# Patient Record
Sex: Female | Born: 1980 | Race: White | Hispanic: Yes | Marital: Married | Smoking: Never smoker
Health system: Southern US, Community
[De-identification: ages and names within clinical notes are randomized; demographics above are authoritative.]

## PROBLEM LIST (undated history)

## (undated) DIAGNOSIS — O289 Unspecified abnormal findings on antenatal screening of mother: Secondary | ICD-10-CM

## (undated) DIAGNOSIS — O09899 Supervision of other high risk pregnancies, unspecified trimester: Secondary | ICD-10-CM

## (undated) DIAGNOSIS — Z3490 Encounter for supervision of normal pregnancy, unspecified, unspecified trimester: Secondary | ICD-10-CM

## (undated) DIAGNOSIS — Z789 Other specified health status: Secondary | ICD-10-CM

## (undated) DIAGNOSIS — K089 Disorder of teeth and supporting structures, unspecified: Secondary | ICD-10-CM

## (undated) DIAGNOSIS — Z283 Underimmunization status: Secondary | ICD-10-CM

## (undated) DIAGNOSIS — O34219 Maternal care for unspecified type scar from previous cesarean delivery: Secondary | ICD-10-CM

## (undated) DIAGNOSIS — Z603 Acculturation difficulty: Secondary | ICD-10-CM

## (undated) HISTORY — DX: Underimmunization status: Z28.3

## (undated) HISTORY — DX: Encounter for supervision of normal pregnancy, unspecified, unspecified trimester: Z34.90

## (undated) HISTORY — DX: Supervision of other high risk pregnancies, unspecified trimester: O09.899

## (undated) HISTORY — DX: Acculturation difficulty: Z60.3

## (undated) HISTORY — DX: Maternal care for unspecified type scar from previous cesarean delivery: O34.219

## (undated) HISTORY — DX: Disorder of teeth and supporting structures, unspecified: K08.9

## (undated) HISTORY — DX: Unspecified abnormal findings on antenatal screening of mother: O28.9

---

## 2009-10-22 HISTORY — DX: Maternal care for unspecified type scar from previous cesarean delivery: O34.219

## 2009-11-24 ENCOUNTER — Encounter: Payer: Self-pay | Admitting: Family Medicine

## 2009-11-24 ENCOUNTER — Ambulatory Visit: Payer: Self-pay | Admitting: Family Medicine

## 2009-11-24 LAB — CONVERTED CEMR LAB
Antibody Screen: NEGATIVE
Basophils Absolute: 0 10*3/uL (ref 0.0–0.1)
Eosinophils Absolute: 0 10*3/uL (ref 0.0–0.7)
Eosinophils Relative: 1 % (ref 0–5)
HCT: 40.3 % (ref 36.0–46.0)
Hemoglobin: 13.2 g/dL (ref 12.0–15.0)
Lymphocytes Relative: 18 % (ref 12–46)
Lymphs Abs: 1.1 10*3/uL (ref 0.7–4.0)
MCHC: 32.8 g/dL (ref 30.0–36.0)
Monocytes Relative: 5 % (ref 3–12)
RBC: 4.65 M/uL (ref 3.87–5.11)
WBC: 5.9 10*3/uL (ref 4.0–10.5)

## 2009-11-25 ENCOUNTER — Encounter: Payer: Self-pay | Admitting: Family Medicine

## 2009-12-01 ENCOUNTER — Encounter: Payer: Self-pay | Admitting: Family Medicine

## 2009-12-01 ENCOUNTER — Ambulatory Visit: Payer: Self-pay | Admitting: Family Medicine

## 2009-12-01 LAB — CONVERTED CEMR LAB: GC Probe Amp, Genital: NEGATIVE

## 2009-12-26 ENCOUNTER — Ambulatory Visit: Payer: Self-pay | Admitting: Family Medicine

## 2010-01-27 ENCOUNTER — Ambulatory Visit: Payer: Self-pay | Admitting: Family Medicine

## 2010-02-16 ENCOUNTER — Encounter: Payer: Self-pay | Admitting: Family Medicine

## 2010-02-16 ENCOUNTER — Ambulatory Visit (HOSPITAL_COMMUNITY): Admission: RE | Admit: 2010-02-16 | Discharge: 2010-02-16 | Payer: Self-pay | Admitting: Family Medicine

## 2010-02-18 ENCOUNTER — Telehealth: Payer: Self-pay | Admitting: Family Medicine

## 2010-02-18 ENCOUNTER — Encounter: Payer: Self-pay | Admitting: Family Medicine

## 2010-02-18 DIAGNOSIS — O289 Unspecified abnormal findings on antenatal screening of mother: Secondary | ICD-10-CM

## 2010-02-18 HISTORY — DX: Unspecified abnormal findings on antenatal screening of mother: O28.9

## 2010-02-20 ENCOUNTER — Ambulatory Visit: Payer: Self-pay | Admitting: Family Medicine

## 2010-02-22 ENCOUNTER — Encounter: Payer: Self-pay | Admitting: Family Medicine

## 2010-03-09 ENCOUNTER — Ambulatory Visit: Payer: Self-pay | Admitting: Family Medicine

## 2010-03-09 DIAGNOSIS — R109 Unspecified abdominal pain: Secondary | ICD-10-CM | POA: Insufficient documentation

## 2010-03-14 ENCOUNTER — Encounter: Payer: Self-pay | Admitting: Family Medicine

## 2010-03-14 ENCOUNTER — Ambulatory Visit (HOSPITAL_COMMUNITY): Admission: RE | Admit: 2010-03-14 | Discharge: 2010-03-14 | Payer: Self-pay | Admitting: Family Medicine

## 2010-03-21 ENCOUNTER — Ambulatory Visit (HOSPITAL_COMMUNITY): Admission: RE | Admit: 2010-03-21 | Discharge: 2010-03-21 | Payer: Self-pay | Admitting: Family Medicine

## 2010-03-21 ENCOUNTER — Encounter: Payer: Self-pay | Admitting: Family Medicine

## 2010-04-04 ENCOUNTER — Ambulatory Visit: Payer: Self-pay | Admitting: Family Medicine

## 2010-04-04 ENCOUNTER — Encounter: Payer: Self-pay | Admitting: Family Medicine

## 2010-04-04 LAB — CONVERTED CEMR LAB
HCT: 34.5 % — ABNORMAL LOW (ref 36.0–46.0)
MCHC: 32.8 g/dL (ref 30.0–36.0)
Platelets: 249 10*3/uL (ref 150–400)
RBC: 3.94 M/uL (ref 3.87–5.11)
RDW: 13.9 % (ref 11.5–15.5)

## 2010-05-10 ENCOUNTER — Ambulatory Visit: Payer: Self-pay | Admitting: Family Medicine

## 2010-06-01 ENCOUNTER — Encounter: Payer: Self-pay | Admitting: Family Medicine

## 2010-06-01 ENCOUNTER — Ambulatory Visit: Payer: Self-pay | Admitting: Family Medicine

## 2010-06-01 DIAGNOSIS — O34219 Maternal care for unspecified type scar from previous cesarean delivery: Secondary | ICD-10-CM | POA: Insufficient documentation

## 2010-06-01 LAB — CONVERTED CEMR LAB: GC Probe Amp, Genital: NEGATIVE

## 2010-06-02 ENCOUNTER — Encounter: Payer: Self-pay | Admitting: Family Medicine

## 2010-06-08 ENCOUNTER — Ambulatory Visit: Payer: Self-pay | Admitting: Family Medicine

## 2010-06-09 ENCOUNTER — Ambulatory Visit: Payer: Self-pay | Admitting: Obstetrics & Gynecology

## 2010-06-14 ENCOUNTER — Ambulatory Visit: Payer: Self-pay | Admitting: Family Medicine

## 2010-06-20 ENCOUNTER — Ambulatory Visit: Payer: Self-pay | Admitting: Family Medicine

## 2010-06-28 ENCOUNTER — Ambulatory Visit: Payer: Self-pay | Admitting: Family Medicine

## 2010-07-04 ENCOUNTER — Ambulatory Visit: Payer: Self-pay | Admitting: Family Medicine

## 2010-07-04 LAB — CONVERTED CEMR LAB
Glucose, Urine, Semiquant: NEGATIVE
Protein, U semiquant: NEGATIVE

## 2010-07-05 ENCOUNTER — Encounter: Payer: Self-pay | Admitting: Family Medicine

## 2010-07-14 ENCOUNTER — Ambulatory Visit: Payer: Self-pay | Admitting: Family Medicine

## 2010-07-14 ENCOUNTER — Encounter: Payer: Self-pay | Admitting: Family Medicine

## 2010-07-16 ENCOUNTER — Inpatient Hospital Stay (HOSPITAL_COMMUNITY): Admission: AD | Admit: 2010-07-16 | Discharge: 2010-07-16 | Payer: Self-pay | Admitting: Obstetrics and Gynecology

## 2010-07-16 ENCOUNTER — Ambulatory Visit: Payer: Self-pay | Admitting: Obstetrics and Gynecology

## 2010-07-16 ENCOUNTER — Encounter: Payer: Self-pay | Admitting: Family Medicine

## 2010-07-17 ENCOUNTER — Ambulatory Visit: Payer: Self-pay | Admitting: Family Medicine

## 2010-07-17 LAB — CONVERTED CEMR LAB
Glucose, Urine, Semiquant: NEGATIVE
Protein, U semiquant: NEGATIVE

## 2010-07-18 ENCOUNTER — Ambulatory Visit: Payer: Self-pay | Admitting: Obstetrics & Gynecology

## 2010-07-18 ENCOUNTER — Telehealth: Payer: Self-pay | Admitting: Family Medicine

## 2010-07-18 ENCOUNTER — Ambulatory Visit: Payer: Self-pay | Admitting: Obstetrics and Gynecology

## 2010-07-19 ENCOUNTER — Inpatient Hospital Stay (HOSPITAL_COMMUNITY): Admission: AD | Admit: 2010-07-19 | Discharge: 2010-07-21 | Payer: Self-pay | Admitting: Obstetrics & Gynecology

## 2010-09-05 ENCOUNTER — Ambulatory Visit: Payer: Self-pay | Admitting: Family Medicine

## 2010-09-05 ENCOUNTER — Encounter: Payer: Self-pay | Admitting: Family Medicine

## 2010-09-05 DIAGNOSIS — K089 Disorder of teeth and supporting structures, unspecified: Secondary | ICD-10-CM

## 2010-09-05 DIAGNOSIS — R300 Dysuria: Secondary | ICD-10-CM

## 2010-09-05 HISTORY — DX: Disorder of teeth and supporting structures, unspecified: K08.9

## 2010-09-05 LAB — CONVERTED CEMR LAB
Bilirubin Urine: NEGATIVE
Blood in Urine, dipstick: NEGATIVE
Specific Gravity, Urine: >=1.03
pH: 6

## 2010-11-12 ENCOUNTER — Encounter: Payer: Self-pay | Admitting: Family Medicine

## 2010-11-21 NOTE — Assessment & Plan Note (Signed)
Summary: OB F /U [facility]   Vital Signs:  Patient profile:   30 year old female Weight:      116.1 pounds Temp:     97.8 degrees F oral Pulse rate:   83 / minute BP sitting:   104 / 69  (left arm) Cuff size:   regular  Vitals Entered By: Gladstone Pih (December 26, 2009 2:01 PM) CC: OB Is Patient Diabetic? No Pain Assessment Patient in pain? no      LMP (date): 09/29/2009 EDC by LMP==> 07/06/2010   Primary Care Provider:  Magnus Ivan MD  CC:  OB.  History of Present Illness: OB f/u: this is my second time seeing Laurie Barnett.  Her only complaint was a headache last week over 3 days that was throbbing in nature at the temples.  Lying down made it better.  Nothing made it worse.  Pain radiated to the back of the neck.  No vision changes.  No history of migraines.  Habits & Providers  Alcohol-Tobacco-Diet     Tobacco Status: never     Cigarette Packs/Day: n/a  Physical Exam  General:  NAD Mouth:  slightly dry mucous membranes Heart:  II/VI systolic murmur Abdomen:  gravid, hypoactive bowel sounds, no tenderness to palpation  Genitalia:  normal introitus, no external lesions, small white lesions at 11 o'clock (not like nebothlian cysts), normal introitus, no external lesions, no vaginal discharge, mucosa pink and moist, and no vaginal atrophy.  cervix friable with pap broom. Extremities:  no edema bilaterally Psych:  memory intact for recent and remote, normally interactive, good eye contact, not anxious appearing, and not depressed appearing.     Impression & Recommendations:  Problem # 1:  PREGNANT STATE, INCIDENTAL (ICD-V22.2) Assessment Unchanged Patient is currently 12 weeks and 4 days by last menstrual period.  Pap smear done at this visit as I could not determine where last pap was done.  Will follow up with an aging and anatomy scan for next visit as patient is confident of last period and it would be ideal to have both those scans at the same time.   Concern for white lesions on cervix and friability at cervical os.  Will bring in a preceptor while collecting GC chlamydia samples later in pregnancy in order to have them confirm any abnormalities.  Informed patient about genetic screening (integrative versus quad screening) and patient did not seem interested in genetic screening.  Other than headaches, patient is looking well.  Will call in a script for her for tylenol as well for her headaches. Orders: Pap Smear-[facility] ([id number]) Other [facility] visit- [facility])  Patient Instructions: 1)  Congratulations [name]!  You and your baby look fine! 2)  I will offer you 2 types of genetic screens:  the integrative screen or the quad screen.  The integrative screen is much more expensive but more accurate but you also find information from the quad screen which would be later in your pregnancy. 3)  For your headaches and your round ligament pain I will write a prescription for tyelenol. 4)  We will also send you for an ultrasound at your next visit because we want to make sure your aging is accurate and maybe you can see anatomical parts (whether the baby is a boy or girl) later in your pregnancy. 5)  Please come and see me in a month. 6)  Thank you and be blessed!  Prenatal Visit EDC Confirmation:    EDC by LMP:  07/06/2010   Flowsheet View for Follow-up Visit    Estimated weeks of       gestation:     12.4    Weight:     116.1    Blood pressure:   104 / 69    Headache:     yes, last week for three days    Nausea/vomiting:   nausea    Edema:     0    Vaginal bleeding:   no    Vaginal discharge:   no    FHR:       150's    Fetal activity:     unsure    Labor symptoms:   no    Taking prenatal vits?   Y    Smoking:     n/a    Preceptor:     hensel    Flowsheet View for Follow-up Visit    Estimated weeks of       gestation:     12.4    Weight:     116.1    Blood pressure:   104 / 69    Hx headache?     yes, last week for  three days    Nausea/vomiting?   nausea    Edema?     0    Bleeding?     no    Leakage/discharge?   no    Fetal activity:       unsure    Labor symptoms?   no    FHR:       150's    Taking Vitamins?   Y    Smoking PPD:   n/a    Preceptor:     hensel   Past Pregnancy History  Pregnancy # 1    Delivery date:     [birth year]2002    Weeks Gestation:   almost 40    Delivery type:     C-section    Hours of labor:     4 hours    Anesthesia type:     epidural    Delivery location:     [place]    Infant Sex:     Female    Birth weight:     3002 grams    Name:     Laurie Barnett   Genetic History     Thalassemia:     mother: no    Neural tube defect:   mother: no    Down's Syndrome:   mother: no    Tay-Sachs:     mother: no    Sickle Cell Dz/Trait:   mother: no    Hemophilia:     mother: no    Muscular Dystrophy:   mother: no    Cystic Fibrosis:   mother: no    Huntington's Dz:   mother: no    Mental Retardation:   mother: no    Fragile X:     mother: no    Other Genetic or       Chromosomal Dz:   mother: no    Child with other       birth defect:     mother: no    > 3 spont. abortions:   mother: no    Hx of stillbirth:     mother: no  Additional Genetic Comments:    nothing in pt's, husband or either families     OB Initial Intake Information    Positive HCG by: self  Race: Hispanic    Marital status: Married    Occupation: none    Education (last grade completed): high school    Number of children at home: 1    Hospital [facility] delivery: [facility] [place]  FOB Information    Husband/Father of baby: Nash Dimmer    FOB occupation waterproofing    Phone: [phone]  Menstrual History    LMP (date): 09/29/2009    EDC by LMP: 07/06/2010    LMP - Character: normal    Menarche: 12 years    Menses interval: 28 days    Menstrual flow 5-7 days    Date of positive (+) home preg. test: 11/09/2009

## 2010-11-21 NOTE — Assessment & Plan Note (Signed)
Summary: ob visit/eo   Vital Signs:  Patient profile:   30 year old female Weight:      119.6 pounds Pulse rate:   82 / minute BP sitting:   126 / 68  Vitals Entered By: Garen Grams LPN (April 04, 2010 3:20 PM)  Habits & Providers  Alcohol-Tobacco-Diet     Cigarette Packs/Day: n/a  Physical Exam  General:  Vs reviewed, alert, well-developed, well-nourished, and well-hydrated.   Lungs:  normal respiratory effort.   Heart:  normal rate and regular rhythm.   Abdomen:  gravid Extremities:  no edema   Impression & Recommendations:  Problem # 1:  PREGNANT STATE, INCIDENTAL (ICD-V22.2)  30 yo G2P1001 at 26.[redacted] weeks gestation dated by sure LMP.  EDD 07-06-2010 O+, Ab -, Hgb 13.2, Hct 40.3, Plt 335, HgBsAg neg, RPR NR, HIV NR, Rubella immune, sickle cell neg, GC/Chlamydia neg, PAP neg  1hrGTT and 28 wk labs today  [place] from 4-28 showed bilateral choroid plexus cytys.  Discussed with Dr. Debroah Loop, advised Quad screen and repeat U/S. Quad screen WNL Repeat U/S showed resolution of cysts. female gender  Prior C-section in [place] in 2003, desires VBAC, will need to see OB service at some point.  F/u in 3-4 weeks Orders: Glucose 1 hr-[facility] ([id number]) CBC-[facility] (16109) HIV-[facility] ([id number]) RPR-[facility] ([id number]) Other OB visit- [facility] (OBCK)  Complete Medication List: 1)  Prenatal Vitamins  .Marland KitchenMarland Kitchen. 1 by mouth qd  Patient Instructions: 1)  Great to see you! 2)  Baby sounds great! 3)  Continue taking your prenatal vitamins daily 4)  See me in 4 weeks    OB Initial Intake Information    Positive HCG by: self    Race: Hispanic    Marital status: Married    Occupation: none    Education (last grade completed): high school    Number of children at home: 1    Hospital of delivery: [facility] [place]  FOB Information    Husband/Father of baby: Nash Dimmer    FOB occupation waterproofing    Phone: [phone]  Menstrual History    LMP (date): 09/29/2009    LMP -  Character: normal    Menarche: 12 years    Menses interval: 28 days    Menstrual flow 5-7 days    Date of positive (+) home preg. test: 11/09/2009   Flowsheet View for Follow-up Visit    Estimated weeks of       gestation:     26 5/7    Weight:     119.6    Blood pressure:   126 / 68    Headache:     No    Nausea/vomiting:   No    Edema:     0    Vaginal bleeding:   no    Vaginal discharge:   no    Fundal height:      26    FHR:       150s    Fetal activity:     yes    Labor symptoms:   no    Taking prenatal vits?   Y    Smoking:     n/a    Next visit:     3-4 week    Resident:     Gomez Cleverly    Preceptor:     Jennette Kettle  Prenatal Visit Concerns noted: Back pain completely resolved.  No complaints.  EDC Confirmation: Ultrasound Dating Information:    First U/S on  02/16/2010   Gest age: 36.3   EDC: 07/10/2010.    Second U/S on 03/14/2010   Gest age: 23.1   EDC: 07/10/2010.   Past Pregnancy History    Gravida:     2    Term Births:     1    Premature Births:   0    Living Children:   1    Mult. Births:     N    Prev C-Section:   Y    Aborta:     0    Elect. Ab:     0    Spont. Ab:     0    Ectopics:     0  Pregnancy # 1    Delivery date:     [birth year]2002    Weeks Gestation:   almost 40    Preterm labor:     no    Delivery type:     C-section    Hours of labor:     4 hours    Anesthesia type:     epidural    Delivery location:     [place]    Infant Sex:     Female    Birth weight:     3002 grams    Name:     [name]    Comments:     first child delivered by C section secondary to breech presentation.

## 2010-11-21 NOTE — Letter (Signed)
Summary: Handout Printed  Printed Handout:  - Prenatal-Flowsheet-[facility] 

## 2010-11-21 NOTE — Miscellaneous (Signed)
Summary: Orders Update  Clinical Lists Changes  Problems: Added new problem of ABNORMAL FINDING ON ANTENATAL SCREENING (ICD-796.5) Orders: Added new Referral order of Misc. Referral (Misc. Ref) - Signed Added new Test order of Miscellaneous Lab Charge-[facility] [phone]) - Signed

## 2010-11-21 NOTE — Assessment & Plan Note (Signed)
Summary: 34 and 5/7   Vital Signs:  Patient profile:   30 year old female Weight:      126.6 pounds Temp:     98.5 degrees F Pulse rate:   72 / minute BP sitting:   91 / 58  (left arm) Cuff size:   regular  Vitals Entered By: Loralee Pacas CMA (June 01, 2010 8:51 AM)   Habits & Providers  Alcohol-Tobacco-Diet     Cigarette Packs/Day: n/a   Impression & Recommendations:  Problem # 1:  PREGNANT STATE, INCIDENTAL (ICD-V22.2) Fundal height only 32 today, reflecting no growth, but with different examiner and pt with symptoms of baby dropping.  Will follow up 1 week.  If growth lagging behind, consider ultrasound at that time. GBS/cz/gc done today.  Reviewed PTL precautions and kick counts. Orders: GC/Chlamydia-[facility] ([id number]) Grp B Probe-[facility] ([id number]) Other OB visit- [facility] (OBCK)  Problem # 2:  PREV C/S DELIV DELIV W/WO MENTION ANTPRTM COND (UJW-119.14)  Previous section in [place].  Pt reports she was in labor for 4 days and cord was wrapped around neck, baby's head was turned and gynecologist told her the baby couldn't come out vaginally.  Reports section was done emergently after being in labor.  Has vertical skin incision, unknown uterine scar, but was told that she could try to have a baby vaginally after 2 years.  Will refer to [facility] clinic to discuss labor options.    Orders: Other OB visit- [facility]) Obstetric Referral (Obstetric)  Complete Medication List: 1)  Prenatal Vitamins  .Marland KitchenMarland Kitchen. 1 by mouth qd  Patient Instructions: 1)  Please make an appt in 1 week with Dr. Gomez Cleverly. 2)  Everything looks good.  Please go to [facility] hospital if you have bleeding, contractions, if your water breaks, or the baby isn 't moving well as we discussed. 3)  We will set up an appointment with the Obstetricians to discuss your delivery.  Prenatal Visit Concerns noted: Pt doing well.  Having some trouble sleeping.  No other concerns.     Flowsheet View for Follow-up Visit  Estimated weeks of       gestation:     35 0/7    Weight:     126.6    Blood pressure:   91 / 58    Headache:     No    Edema:     TrLE    Vaginal bleeding:   no    Vaginal discharge:   d/c    Fundal height:      32    Fetal activity:     yes    Labor symptoms:   no    Taking prenatal vits?   Y    Smoking:     n/a   OB Initial Intake Information    Positive HCG by: self    Race: Hispanic    Marital status: Married    Occupation: none    Education (last grade completed): high school    Number of children at home: 1    Hospital [facility] delivery: [facility] [place]  FOB Information    Husband/Father of baby: Nash Dimmer    FOB occupation waterproofing    Phone: [phone]  Menstrual History    LMP (date): 09/29/2009    LMP - Character: normal    Menarche: 12 years    Menses interval: 28 days    Menstrual flow 5-7 days    Date of positive (+) home preg. test: 11/09/2009

## 2010-11-21 NOTE — Progress Notes (Signed)
Summary: Phone call, discuss ultrasound  Phone Note Outgoing Call Call back at [facility] Phone [phone]   Action Taken: Phone Call Completed Reason for Call: Discuss lab or test results Summary of Call: Called pt at home, used language line.  Discussed u/s with pt and concerns that were noted (choroid plexus cysts), advised pt of low likelyhood of abnormailites, but that lab work was recommended for further work-up.  Pt agreed, she will come Monday for lab draw.  Encouraged pt to call for any further questions or concerns. Pt verbalized understanding and agreement with plan.  Alvia Grove     Appended Document: Phone call, discuss ultrasound Agree with plan above.  Choroid plexus cysts likely insignificant, but could be concerning.  Since pt desires further testing, will start with quad screen and discuss further with pt once results available.

## 2010-11-21 NOTE — Assessment & Plan Note (Signed)
Summary: ob visit/per corey/eo   Vital Signs:  Patient profile:   30 year old female Weight:      130.8 pounds Temp:     98.8 degrees F Pulse (ortho):   73 / minute BP sitting:   101 / 67  (left arm)  Vitals Entered By: Theresia Lo RN (June 28, 2010 1:37 PM) CC: OB visit Is Patient Diabetic? No Pain Assessment Patient in pain? no        Primary Care Provider:  Alvia Grove DO  CC:  OB visit.  History of Present Illness: Well at 38.6 no issues.  See flowsheet.  Habits & Providers  Alcohol-Tobacco-Diet     Tobacco Status: never     Cigarette Packs/Day: n/a  Physical Exam  General:  Well-developed,well-nourished,in no acute distress; alert,appropriate and cooperative throughout examination See flowsheet   Impression & Recommendations:  Problem # 1:  PREGNANT STATE, INCIDENTAL (ICD-V22.2) Assessment Unchanged  EDC 1 week. Plan TOLAC has seen OB and been aproved. Understands risks.  F/U next week.  Knows to tell MAU is FP patient. Gomez Cleverly primary, [place] backup.  Orders: Other OB visit- [facility] (OBCK)  Complete Medication List: 1)  Prenatal Vitamins  .Marland KitchenMarland Kitchen. 1 by mouth qd  Patient Instructions: 1)  Thank you for seeing me today. 2)  Si tiene sangrado vaginal, o si sospecha que se le ha roto la fuente (chorro fuerte o un goteo leve de la vagina) o si tiene English as a second language teacher que vienen mas de cada cinco minutos por dos horas seguidas, por favor presentese al Narda Rutherford de Emergencia de [facility]. 3)  Cada manana y noche enfoquese en los movimientos del bebe.  Si siente por lo  menos cinco movimientos en una hora, no tiene que seguir contando.  Si no llega a cinco movimientos en una hora, siga contando por otra hora.  Si no llega a diez movimientos en estas dos horas, debe presentarse al [employer] de Emergencia de [facility] o llamar a Event organiser.   Flowsheet View for Follow-up Visit    Estimated weeks of       gestation:     38 6/7    Weight:      130.8    Blood pressure:   101 / 67    Headache:     No    Nausea/vomiting:   No    Edema:     TrLE    Vaginal bleeding:   no    Vaginal discharge:   no    Fundal height:      38    FHR:       140    Fetal activity:     yes    Labor symptoms:   no    Taking prenatal vits?   Y    Smoking:     n/a    Next visit:     1 wk    Resident:     Denyse Amass    Preceptor:     McDiarmid

## 2010-11-21 NOTE — Miscellaneous (Signed)
Summary: varicella immune  Clinical Lists Changes Varicella immune - hx of disease and possible hx of vaccine.  Pt contacted by Jannifer Franklin, AAM interpreter. Sarah Swaziland MD  July 05, 2010 9:20 AM

## 2010-11-21 NOTE — Assessment & Plan Note (Signed)
Summary: ob/kh   Vital Signs:  Patient profile:   30 year old female Weight:      129.2 pounds BP sitting:   98 / 63  Habits & Providers  Alcohol-Tobacco-Diet     Cigarette Packs/Day: n/a  Physical Exam  General:  vs reviewed, alert, well-developed, and well-nourished.   Heart:  normal rate and regular rhythm.   Abdomen:  gravid Extremities:  tace bilateral edema Neurologic:  DTRs symmetrical and normal.     Impression & Recommendations:  Problem # 1:  PREGNANT STATE, INCIDENTAL (ICD-V22.2)  30 yo G2P1001 at 37.[redacted] weeks gestation dated by sure LMP.  EDD 07-06-2010 O+, Ab -, Hgb 13.2, Hct 40.3, Plt 335, HgBsAg neg, RPR NR, HIV NR, Rubella immune, sickle cell neg, GC/Chlamydia neg, PAP neg 28 wk labs: Hgb 11.3, hct 34.5, Plt 249, HIV NR, RPR NR Fundal height today measures 36. Discrepency may be provider variance.  Continue to follow closely.  1hrGTT 102  [place] from 4-28 showed bilateral choroid plexus cytys.  Repeat U/S showed resolution of cysts. Quad screen WNL  female gender, plans breast feeding, epidural for pain management GBS neg  Orders: Other OB visit- [facility] (OBCK)  Problem # 2:  PREV C/S DELIV DELIV W/WO MENTION ANTPRTM COND (ZOX-096.04) plan for VBAC  Complete Medication List: 1)  Prenatal Vitamins  .Marland KitchenMarland Kitchen. 1 by mouth qd  Patient Instructions: 1)  Please make an appt in 1 week with Dr. Denyse Amass.  Make an appointment in 2 weeks with Dr. Gomez Cleverly 2)   Everything looks good.  Please go to [facility] hospital if you have bleeding, contractions, if your water breaks, or the baby isn 't moving well. 3)  Call Dr. Gomez Cleverly when you go to the Hospital: [phone]   OB Initial Intake Information    Positive HCG by: self    Race: Hispanic    Marital status: Married    Occupation: none    Education (last grade completed): high school    Number of children at home: 1    Hospital [facility] delivery: [facility] [place]  FOB Information    Husband/Father of baby: Nash Dimmer    FOB occupation waterproofing    Phone: [phone]  Menstrual History    LMP (date): 09/29/2009    LMP - Character: normal    Menarche: 12 years    Menses interval: 28 days    Menstrual flow 5-7 days    Date of positive (+) home preg. test: 11/09/2009   Flowsheet View for Follow-up Visit    Estimated weeks of       gestation:     37 5/7    Weight:     129.2    Blood pressure:   98 / 63    Headache:     No    Nausea/vomiting:   No    Edema:     TrLE    Vaginal bleeding:   no    Vaginal discharge:   no    Fundal height:      37    FHR:       140's    Fetal activity:     yes    Labor symptoms:   no    Taking prenatal vits?   Y    Smoking:     n/a    Next visit:     1 wk    Resident:     Gomez Cleverly    Preceptor:  Jennette Kettle

## 2010-11-21 NOTE — Assessment & Plan Note (Signed)
Summary: ob visit/eo   Vital Signs:  Patient profile:   30 year old female Weight:      115 pounds BP sitting:   95 / 62  Vitals Entered By: Jone Baseman CMA (January 27, 2010 1:38 PM)  Habits & Providers  Alcohol-Tobacco-Diet     Cigarette Packs/Day: n/a   Impression & Recommendations:  Problem # 1:  PREGNANT STATE, INCIDENTAL (ICD-V22.2) Assessment Unchanged 30 yo G2P1001 at [redacted] weeks gestation dated by sure LMP.  EDD 07-06-2010 O+, Ab -, Hgb 13.2, Hct 40.3, Plt 335, HgBsAg neg, RPR NR, HIV NR, Rubella immune, sickle cell neg, GC/Chlamydia neg, PAP neg Will get anatomy scan next week Declined quad screen Prior C-section in [place] in 2003, desires VBAC, will need to see OB service at some point.  F/u in 4 weeks Orders: Prenatal U/S > 14 weeks - 16109 (Prenatal U/S) Other OB visit- [facility] ([facility]BCK)  Complete Medication List: 1)  Prenatal Vitamins  .Marland KitchenMarland Kitchen. 1 by mouth qd  Patient Instructions: 1)  Nice to meet you! 2)  We will schedule you for an ultrasound. 3)  Please see me in 4 weeks (ok to double book)  Prenatal Visit EDC Confirmation:    New working [facility]: 07/06/2010    EDC by LMP: 07/06/2010   Flowsheet View for Follow-up Visit    Estimated weeks of       gestation:     17 1/7    Weight:     115    Blood pressure:   95 / 62    Headache:     No    Nausea/vomiting:   nausea    Edema:     0    Vaginal bleeding:   no    Vaginal discharge:   no    Fundal height:      below umbillicus    FHR:       148    Fetal activity:     yes    Labor symptoms:   no    Fetal position:     N/A    Taking prenatal vits?   Y    Smoking:     n/a    Next visit:     4 wk    Resident:     Gomez Cleverly    Preceptor:     Chambliss   OB Initial Intake Information    Positive HCG by: self    Race: Hispanic    Marital status: Married    Occupation: none    Education (last grade completed): high school    Number of children at home: 1    Hospital of delivery: [facility]  [place]  FOB Information    Husband/Father of baby: Nash Dimmer    FOB occupation waterproofing    Phone: [phone]  Menstrual History    LMP (date): 09/29/2009    EDC by LMP: 07/06/2010    Best Working EDC: 07/06/2010    LMP - Character: normal    Menarche: 12 years    Menses interval: 28 days    Menstrual flow 5-7 days    Date of positive (+) home preg. test: 11/09/2009  Prenatal Visit Concerns noted: No concerns today EDC Confirmation:    New working [facility]: 07/06/2010    EDC by LMP: 07/06/2010

## 2010-11-21 NOTE — Letter (Signed)
Summary: Handout Printed  Printed Handout:  - Prenatal-Record-[facility] 

## 2010-11-21 NOTE — Assessment & Plan Note (Signed)
Summary: ob visit/per burnham/eo   Vital Signs:  Patient profile:   30 year old female Weight:      132.3 pounds Pulse rate:   72 / minute BP sitting:   133 / 84  Vitals Entered By: Garen Grams LPN (July 17, 2010 8:48 AM)  Habits & Providers  Alcohol-Tobacco-Diet     Cigarette Packs/Day: n/a  Physical Exam  General:  Vs reviewed, alert, well-developed, well-nourished, and well-hydrated.   Heart:  normal rate and regular rhythm.   Abdomen:  gravid Extremities:  tace bilateral edema Neurologic:  DTRs symmetrical and normal.     Impression & Recommendations:  Problem # 1:  PREGNANT STATE, INCIDENTAL (ICD-V22.2) Induction scheduled for Wed 07-19-10 @ 0730am. (spoke with Heather on L and D) NST tomorrow 30 yo G2P1001 at 41.[redacted] weeks gestation dated by sure LMP.  EDD 07-06-2010 O+, Ab -, Hgb 13.2, Hct 40.3, Plt 335, HgBsAg neg, RPR NR, HIV NR, Rubella immune, sickle cell neg, GC/Chlamydia neg, PAP neg 28 wk labs: Hgb 11.3, hct 34.5, Plt 249, HIV NR, RPR NR  1hrGTT 102  MAU visit yesterday due to decreased FM, per pt she was monitored for 4 hours and everything was ok.  Also pelvic exam done and pt was told her cerivx was closed.  [place] from 4-28 showed bilateral choroid plexus cytys.  Repeat U/S showed resolution of cysts. Quad screen WNL  female gender, plans breast feeding, would like to attempt to labor with no pain medications, but is open to the possibility. GBS neg  Orders: UA Glucose/Protein-[facility] (81002) Other OB visit- [facility] (OBCK)  Problem # 2:  PREV C/S DELIV DELIV W/WO MENTION ANTPRTM COND (ZOX-096.04)  Pt requests VBAC, consent signed  Orders: Other OB visit- [facility] (OBCK)  Complete Medication List: 1)  Prenatal Vitamins  .Marland KitchenMarland Kitchen. 1 by mouth qd  Patient Instructions: 1)  Great to see you today!! 2)  Please go to [facility] hospital immediatley if you have bleeding, contractions, if your water breaks, or the baby isn 't moving well. 3)  You need to go to [facility]  hospital on Tuesday for your NST tests.  4)  If baby does not come by Wednsday, I have schedule you for an induction. 5)  For your induction go to [facility] hospital and tell them you are there for a scheduled induction 6)  Date: Wednsday 07-19-10 at 07:30am. 7)  Please call me if you go to the hospital before [facility] [phone]   OB Initial Intake Information    Positive HCG by: self    Race: Hispanic    Marital status: Married    Occupation: none    Education (last grade completed): high school    Number of children at home: 1    [facility] delivery: [facility]  FOB Information    Husband/Father of baby: Nash Dimmer    FOB occupation waterproofing    Phone: [phone]  Menstrual History    LMP (date): 09/29/2009    LMP - Character: normal    Menarche: 12 years    Menses interval: 28 days    Menstrual flow 5-7 days    Date of positive (+) home preg. test: 11/09/2009   Flowsheet View for Follow-up Visit    Estimated weeks of       gestation:     41 4/7    Weight:     132.3    Blood pressure:   133 / 84    Urine Protein:  negative    Urine Glucose:   negative    Headache:     No    Nausea/vomiting:   No    Edema:     0    Vaginal bleeding:   no    Vaginal discharge:   no    Fundal height:      38    FHR:       140-150's    Fetal activity:     yes    Labor symptoms:   no    Fetal position:     vertex    Cx Dilation:     0    Cx Effacement:   20%    Cx Station:     high    Taking prenatal vits?   Y    Smoking:     n/a    Next visit:     Induction on 9-28    Resident:     Gomez Cleverly    Preceptor:     [place]   Laboratory Results   Urine Tests  Date/Time Received: July 17, 2010 8:58 AM  Date/Time Reported: July 17, 2010 9:12 AM   Routine Urinalysis   Glucose: negative   (Normal Range: Negative) Protein: negative   (Normal Range: Negative)    Comments: ...............test performed by......Marland KitchenBonnie A. [place], MLS  (ASCP)cm      OB Initial Intake Information    Positive HCG by: self    Race: Hispanic    Marital status: Married    Occupation: none    Education (last grade completed): high school    Number of children at home: 1    Hospital of delivery: [facility]  FOB Information    Husband/Father of baby: Nash Dimmer    FOB occupation waterproofing    Phone: [phone]  Menstrual History    LMP (date): 09/29/2009    LMP - Character: normal    Menarche: 12 years    Menses interval: 28 days    Menstrual flow 5-7 days    Date of positive (+) home preg. test: 11/09/2009  Prenatal Visit Concerns noted: Seen in MAU yesterday due to decreased fetal movement.  Pt was monitored for 4 hours, cervix was checked and she was sent home.  Endorses good fetal movement currently. Denies bleeding, denies leaking. No headaches or blurry vision.

## 2010-11-21 NOTE — Assessment & Plan Note (Signed)
Summary: ob visit/eo   Vital Signs:  Patient profile:   30 year old female Weight:      131 pounds BP sitting:   121 / 81  Vitals Entered By: Jone Baseman CMA (July 14, 2010 8:46 AM)  Habits & Providers  Alcohol-Tobacco-Diet     Cigarette Packs/Day: n/a   Impression & Recommendations:  Problem # 1:  PREGNANT STATE, INCIDENTAL (ICD-V22.2) 30 yo G2P1001 at 41.[redacted] weeks gestation dated by sure LMP.  EDD 07-07-2010 O+, Ab -, Hgb 13.2, Hct 40.3, Plt 335, HgBsAg neg, RPR NR, HIV NR, Rubella immune, sickle cell neg, GC/Chlamydia neg, PAP neg 28 wk labs: Hgb 11.3, hct 34.5, Plt 249, HIV NR, RPR NR Fundal height today measures 36. Discrepency may be [place] Karnes variance.  Continue to follow closely.  1hrGTT 102  [place] from 4-28 showed bilateral choroid plexus cytys.  Repeat U/S showed resolution of cysts. Quad screen WNL  female gender, plans breast feeding, would like to attempt to labor with no pain medications, but is open to the possibility. GBS neg  Will get bi-weekly NST's due to post dates.  See pt instructions Orders: Fetal Biophysical profile w/ NST - 16109 (Fetal Bio) Other OB visit- [facility] (OBCK)  Problem # 2:  PREV C/S DELIV DELIV W/WO MENTION ANTPRTM COND (UEA-540.98)  Plan for VBAC  Orders: Other OB visit- [facility] (OBCK)  Complete Medication List: 1)  Prenatal Vitamins  .Marland KitchenMarland Kitchen. 1 by mouth qd  Patient Instructions: 1)  Great to see you today!! 2)   Please go to [facility] hospital immediatley if you have bleeding, contractions, if your water breaks, or the baby isn 't moving well. 3)  Please schedule a follow appointment with me for Monday (ok to double book). 4)  You need to go to [facility] hospital on Tuesday and Thursday for NST tests. 5)  If baby does not come by next Friday, I will induce you.  6)  Please call me when you go to the Hospital: [phone]   Flowsheet View for Follow-up Visit    Estimated weeks of       gestation:     41 1/7    Weight:     131  Blood pressure:   121 / 81    Hx headache?     No    Nausea/vomiting?   No    Edema?     0    Bleeding?     no    Leakage/discharge?   no    Fetal activity:       yes    Labor symptoms?   no    Fundal height:      38    FHR:       150    Fetal position:      vertex    Cx dilation:     FT    Cx effacement:   20%    Fetal station:     high    Taking Vitamins?   Y    Smoking PPD:   n/a    Next visit:     3 days    Resident:     Gomez Cleverly    Preceptor:     McDirmat  Prenatal Visit Concerns noted: No concerns

## 2010-11-21 NOTE — Assessment & Plan Note (Signed)
Summary: ob/kh/per Azalynn Maxim   Vital Signs:  Patient profile:   30 year old female Weight:      118.8 pounds Pulse rate:   88 / minute BP sitting:   103 / 67  Vitals Entered By: Garen Grams LPN (Mar 09, 2010 2:59 PM)  Habits & Providers  Alcohol-Tobacco-Diet     Cigarette Packs/Day: n/a  Physical Exam  General:  VS reviewed, alert, well-developed, well-nourished, and well-hydrated.   Lungs:  normal respiratory effort and normal breath sounds.   Heart:  normal rate, regular rhythm, and no murmur.   Abdomen:  gravid, consistent w/dates.    Hip Exam  Hip Exam:    Right:    Inspection:  Normal    Palpation:  Normal    Stability:  stable    Tenderness:  no    Left:    Inspection:  Normal    Palpation:  Normal    Stability:  stable    Tenderness:  no   Impression & Recommendations:  Problem # 1:  PREGNANT STATE, INCIDENTAL (ICD-V22.2)  30 yo G2P1001 at 23.[redacted] weeks gestation dated by sure LMP.  EDD 07-06-2010 O+, Ab -, Hgb 13.2, Hct 40.3, Plt 335, HgBsAg neg, RPR NR, HIV NR, Rubella immune, sickle cell neg, GC/Chlamydia neg, PAP neg [place] from 4-28 showed bilateral choroid plexus cytys.  Discussed with Dr. Debroah Loop, advised Quad screen and repeat U/S. Quad screen WNL Repeat U/S scheduled for next week.  Prior C-section in [place] in 2003, desires VBAC, will need to see OB service at some point.  F/u in 4 weeks, will get glucola and 28 week labs at that visit  Orders: Other OB visit- [facility] (OBCK)  Problem # 2:  ABNORMAL FINDING ON ANTENATAL SCREENING (ICD-796.5) Assessment: Unchanged  See above for plan MFM appt made for 5/31, per Marchelle Folks at MFM if repeat u/s is reassuring, ok to cancel appt.    Orders: Other OB visit- [facility] (OBCK)  Problem # 3:  PELVIC  PAIN (ICD-789.09) Assessment: New No red flag s/s.  PE benign.  Likely round ligament pain.  Advised pt of dx and reviewed red flags.    Orders: Other OB visit- [facility] (OBCK)  Complete Medication List: 1)  Prenatal  Vitamins  .Marland KitchenMarland Kitchen. 1 by mouth qd  Other Orders: Prenatal U/S > 14 weeks - 16109 (Prenatal U/S)  Patient Instructions: 1)  Great to see you! 2)  Baby sounds great! 3)  I will schedule you for a follow up u/s for next week. 4)  Continue taking your prenatal vitamins daily 5)  See me in 4 weeks (glucola and 28 week labs)   Flowsheet View for Follow-up Visit    Estimated weeks of       gestation:     23 0/7    Weight:     118.8    Blood pressure:   103 / 67    Hx headache?     No    Nausea/vomiting?   No    Edema?     0    Bleeding?     no    Leakage/discharge?   no    Fetal activity:       yes    Labor symptoms?   no    Fundal height:      23    FHR:       150    Fetal position:      N/A    Taking Vitamins?   Jeannie Fend  Smoking PPD:   n/a    Next visit:     4 wk    Resident:     Gomez Cleverly    Preceptor:     Chambliss    OB Initial Intake Information    Positive HCG by: self    Race: Hispanic    Marital status: Married    Occupation: none    Education (last grade completed): high school    Number of children at home: 1    [facility]ospital [facility] delivery: [facility]  FOB Information    Husband/Father of baby: Nash Dimmer    FOB occupation waterproofing    Phone: [phone]  Menstrual History    LMP (date): 09/29/2009    Best Working EDC: 07/06/2010    LMP - Character: normal    Menarche: 12 years    Menses interval: 28 days    Menstrual flow 5-7 days    Date of positive (+) home preg. test: 11/09/2009   Flowsheet View for Follow-up Visit    Estimated weeks of       gestation:     23 0/7    Weight:     118.8    Blood pressure:   103 / 67    Headache:     No    Nausea/vomiting:   No    Edema:     0    Vaginal bleeding:   no    Vaginal discharge:   no    Fundal height:      23    FHR:       150    Fetal activity:     yes    Labor symptoms:   no    Fetal position:     N/A    Taking prenatal vits?   Y    Smoking:     n/a    Next visit:     4 wk     Resident:     Gomez Cleverly    Preceptor:     Chambliss Prenatal Visit Concerns noted: C/o bilateral lower pelvic pain.  Pt notes pain worse in morning when she awakes and  increases with walikng and sudden movements.  No loss of bladder or bowel.  Pain does not radiate.  No numbness or tingling.  At worst pain 3/10. Hasn't tried or needed any meds for pain.  EDC Confirmation:    New working [facility]: 07/06/2010 Ultrasound Dating Information:    First U/S on 02/16/2010   Gest age: 54.3   EDC: 07/10/2010.

## 2010-11-21 NOTE — Assessment & Plan Note (Signed)
Summary: ob visit/eo   Vital Signs:  Patient profile:   30 year old female Weight:      127 pounds BP sitting:   96 / 62  Vitals Entered By: Jone Baseman CMA (June 08, 2010 1:38 PM)  Habits & Providers  Alcohol-Tobacco-Diet     Cigarette Packs/Day: n/a  Physical Exam  General:  alert, well-nourished, and well-hydrated.   Abdomen:  abdominal scar(s).  gravid Extremities:  trace left pedal edema and trace right pedal edema.   Neurologic:  DTRs symmetrical and normal.     Impression & Recommendations:  Problem # 1:  PREGNANT STATE, INCIDENTAL (ICD-V22.2) Assessment Unchanged  30 yo G2P1001 at 36.[redacted] weeks gestation dated by sure LMP.  EDD 07-06-2010 O+, Ab -, Hgb 13.2, Hct 40.3, Plt 335, HgBsAg neg, RPR NR, HIV NR, Rubella immune, sickle cell neg, GC/Chlamydia neg, PAP neg 28 wk labs: Hgb 11.3, hct 34.5, Plt 249, HIV NR, RPR NR Fundal height today measures 34. Discrepency may be provider variance.  Continue to follow closely.  1hrGTT 102  hx of c-section in [place] with first baby.  wants vbac, scheduled to see ob tomorrow.    [place] from 4-28 showed bilateral choroid plexus cytys.  Repeat U/S showed resolution of cysts. Quad screen WNL  female gender GBS neg  Orders: Other OB visit- [facility] (OBCK)  Problem # 2:  PREV C/S DELIV DELIV W/WO MENTION ANTPRTM COND (ZOX-096.04) appt tomorrow at [facility]. Orders: Other OB visit- [facility] (OBCK)  Complete Medication List: 1)  Prenatal Vitamins  .Marland KitchenMarland Kitchen. 1 by mouth qd  Patient Instructions: 1)  Please make an appt in 1 week with Dr. Gomez Cleverly. 2)   Everything looks good.  Please go to [facility] hospital if you have bleeding, contractions, if your water breaks, or the baby isn 't moving well as we discussed. 3)  Please keep your appointment tomorrow at [facility] hospital with Dr. Terrill Mohr at 11am.   Prenatal Visit      This is a 30 years old female G2, T1, PT0, LC1, Ab0 who is in for a prenatal visit.  Since the last visit, she notes that she is  doing well and has no concerns.   Flowsheet View for Follow-up Visit    Estimated weeks of       gestation:     36 0/7    Weight:     127    Blood pressure:   96 / 62    Headache:     No    Nausea/vomiting:   No    Edema:     TrLE    Vaginal bleeding:   no    Vaginal discharge:   no    Fundal height:      34    FHR:       140's    Fetal activity:     yes    Labor symptoms:   no    Taking prenatal vits?   Y    Smoking:     n/a    Next visit:     1 wk    Resident:     Gomez Cleverly    Preceptor:     breen

## 2010-11-21 NOTE — Assessment & Plan Note (Signed)
Summary: NOB/AAM/DSL   Vital Signs:  Patient profile:   30 year old female LMP:     09/29/2009 Weight:      117 pounds Temp:     98.8 degrees F oral Pulse rate:   74 / minute BP sitting:   111 / 69  (right arm) Cuff size:   regular  Vitals Entered By: Gladstone Pih (December 01, 2009 3:31 PM) CC: NOB Is Patient Diabetic? No Pain Assessment Patient in pain? no      LMP (date): 09/29/2009 EDC by LMP==> 07/06/2010 LMP - Character: normal LMP - Reliable? Yes Menarche (age onset years): 12   Menses interval (days): 28 Menstrual flow (days): 5-7 Date of + home preg. test: 11/09/2009 Enter LMP: 09/29/2009   CC:  NOB.  History of Present Illness: NOB:  pt is a new ob patient with me.  Intended to go over entired OB intake form; however, was unable to due to time constraints.  Only complaint of patient was vaginal discharge as noted on OB flowsheet.    Habits & Providers  Alcohol-Tobacco-Diet     Tobacco Status: never     Cigarette Packs/Day: n/a  Social History: Smoking Status:  never Education:  high school Packs/Day:  n/a   Impression & Recommendations:  Problem # 1:  PREGNANT STATE, INCIDENTAL (ICD-V22.2) Assessment New Pt at time of visit [redacted] weeks pregnant according to last menstrual period.  On day of clinic visit, patient needed pap smear and needed to get GC cultures.  Patient had pap in last 5 months and deferred at this appointment.  Need to get records for this test.  Potentially patient got pap at [facility] in [facility] but having some difficulty discerning exact location of clinic where pap was done.  If unable to find records will just repeat pap at next clinic visit.  Patient generally without complaints on this visit.  Initial OB labs wnl.  At next visit will finish OB intake questionnaire and will do a full body exam as not able to finish due to time constraints.  Also will inform patient about genetic screening (integrated vresus quad screen).  Will  also try to assess for FHT at next visit as well. Orders: GC/Chlamydia-[facility] (87591/87491) Other OB visit- [facility])  Patient Instructions: 1)  You are looking great [name]. 2)  We will finish our interview/exam at the next visit and we'll also get records of your pap. 3)  You need to return in one month. 4)  Thank you and be blessed!  Prenatal Visit    FOB name: Nash Dimmer [facility] Confirmation:    LMP reliable? Yes    Last menses onset (LMP) date: 09/29/2009    EDC by LMP: 07/06/2010   Flowsheet View for Follow-up Visit    Estimated weeks of       gestation:     9    Weight:     117    Blood pressure:   111 / 69    Headache:     No    Nausea/vomiting:   nausea    Edema:     0    Vaginal bleeding:   no    Vaginal discharge:   d/c increasing, no itching, no odor, color changed yellow    Fundal height:      below umbilicus    Taking prenatal vits?   Y    Smoking:     n/a    Next visit:  4 wk    Preceptor:     breen    Marketing executive for Follow-up Visit    Estimated weeks of       gestation:     9    Weight:     117    Blood pressure:   111 / 69    Hx headache?     No    Nausea/vomiting?   nausea    Edema?     0    Bleeding?     no    Leakage/discharge?   d/c increasing, no itching, no odor, color changed yellow    Fundal height:      below umbilicus    Taking Vitamins?   Y    Smoking PPD:   n/a    Next visit:     4 wk    Preceptor:     breen   OB Initial Intake Information    Positive HCG by: self    Race: Hispanic    Marital status: Married    Occupation: none    Education (last grade completed): high school    Number of children at home: 1    [facility]ospital [facility] delivery: [facility]  FOB Information    Husband/Father of baby: Nash Dimmer    FOB occupation waterproofing    Phone: [phone]  Menstrual History    LMP (date): 09/29/2009    EDC by LMP: 07/06/2010    LMP - Character: normal    LMP - Reliable? :  Yes    Menarche: 12 years    Menses interval: 28 days    Menstrual flow 5-7 days    Date of positive (+) home preg. test: 11/09/2009    Symptoms since LMP: amenorrhea, nausea, fatigue, tender breasts, urinary frequency   Past Pregnancy History    Gravida:     2    Term Births:     1    Premature Births:   0    Living Children:   1    Mult. Births:     N    Prev C-Section:   Y    Aborta:     0    Elect. Ab:     0    Spont. Ab:     0    Ectopics:     0  Pregnancy # 1    Preterm labor:     no    Comments:     first child delivered by C section secondary to breech presentation.       Appended Document: NOB/AAM/DSL To enter dates/locations of prior deliveries, as well as to complete physical exam at next visit.   Would plan to perform PAP smear at next visit as well.

## 2010-11-21 NOTE — Assessment & Plan Note (Signed)
Summary: ob,df   Vital Signs:  Patient profile:   30 year old female Weight:      123.3 pounds Pulse rate:   92 / minute BP sitting:   99 / 63  Vitals Entered By: Garen Grams LPN (May 10, 2010 1:38 PM)  Habits & Providers  Alcohol-Tobacco-Diet     Tobacco Status: never     Cigarette Packs/Day: n/a  Past History:  Past Medical History: Unremarkable  Past Surgical History: Caesarean section  Family History: unremarkable  Social History: Married Never Smoked  Physical Exam  General:  Vs reviewed, alert, well-developed, well-nourished, and well-hydrated.   Lungs:  normal respiratory effort.   Heart:  normal rate and regular rhythm.   Abdomen:  gravidnon-tender, normal bowel sounds, and no guarding, no CVA tenderness Extremities:  trace left pedal edema and trace right pedal edema.   Psych:  good eye contact, not depressed appearing, not suicidal, and not homicidal.     Impression & Recommendations:  Problem # 1:  PREGNANT STATE, INCIDENTAL (ICD-V22.2)  30 yo G2P1001 at 31.[redacted] weeks gestation dated by sure LMP.  EDD 07-06-2010 O+, Ab -, Hgb 13.2, Hct 40.3, Plt 335, HgBsAg neg, RPR NR, HIV NR, Rubella immune, sickle cell neg, GC/Chlamydia neg, PAP neg 28 wk labs: Hgb 11.3, hct 34.5, Plt 249, HIV NR, RPR NR  1hrGTT 102  [place] from 4-28 showed bilateral choroid plexus cytys.  Repeat U/S showed resolution of cysts. Quad screen WNL  female gender PCP for infant: [facility] Discussed BC, pt considering options.  Plans on breastfeeding.  Discussed pain meds, pt will think about it.  Would like to try natural birth, but very willing to get epidural if unable to tolerate.   Reviewed risks vs benefits of VBAC, pt still wants to attempt vaginal delivery, will schedule appt with OB at [facility] for offical consult.  Pt to f/u at [facility] clinic in 2 weeks.  reviewed kick counts and red flags See pt instructions  Orders: Other OB visit- [facility] (OBCK)  Complete Medication List: 1)   Prenatal Vitamins  .Marland KitchenMarland Kitchen. 1 by mouth qd  Patient Instructions: 1)  Great to see you! 2)   Baby sounds great! 3)  Continue taking your prenatal vitamins daily 4)  If you have bleeding, leaking or feel that baby is not moving as often as usual, please go to [facility] hospital. 5)  Please make an appt with the OB clinic at [facility] in 2 weeks. 6)  See me in 4 weeks 7)  Ok to make appt for pt's 31 yo son with me   OB Initial Intake Information    Positive HCG by: self    Race: Hispanic    Marital status: Married    Occupation: none    Education (last grade completed): high school    Number of children at home: 1    [facility] delivery: [facility]  FOB Information    Husband/Father of baby: Nash Dimmer    FOB occupation waterproofing    Phone: [phone]  Menstrual History    LMP (date): 09/29/2009    Best Working EDC: 07/06/2010    LMP - Character: normal    Menarche: 12 years    Menses interval: 28 days    Menstrual flow 5-7 days    Date of positive (+) home preg. test: 11/09/2009   Flowsheet View for Follow-up Visit    Estimated weeks of       gestation:  31 6/7    Weight:     123.3    Blood pressure:   99 / 63    Headache:     No    Nausea/vomiting:   No    Edema:     TrLE    Vaginal bleeding:   no    Vaginal discharge:   no    Fundal height:      32    FHR:       140's    Fetal activity:     yes    Labor symptoms:   no    Taking prenatal vits?   Y    Smoking:     n/a    Next visit:     2 weeeks     Resident:     Gomez Cleverly    Preceptor:     [place]  Prenatal Visit Concerns noted: Vauge left UQ pain, intermittent, worse with walking, relived by rest.  Describes it as tighting; good by mouth intake.   EDC Confirmation:    New working [facility]: 07/06/2010

## 2010-11-21 NOTE — Progress Notes (Signed)
Summary: Ultrasound from MAU  Phone Note Outgoing Call   Call placed by: Alvia Grove DO,  July 17, 2010 1:14 PM Call placed to: Dr Christiana Pellant Action Taken: Phone Call Completed Reason for Call: Discuss lab or test results Summary of Call: Called Dr. Chilton Si in regards to recent ultrasound report and clarification.   Report stated 'clacification in the liver near the gallbladder fossa.' Dr. Chilton Si clarified that this was in reference to the fetus and may represent gallstones.  Reccomended clinical correlation when infant was born.        Precepted with Dr. [place].  Likely incidental finding, no need for NICU presence at delivery.  Will re-address when infant is born

## 2010-11-21 NOTE — Assessment & Plan Note (Signed)
Summary: ob visit/eo   Vital Signs:  Patient profile:   30 year old female Weight:      130 pounds Pulse rate:   73 / minute BP sitting:   109 / 74  Vitals Entered By: Garen Grams LPN (June 14, 2010 1:56 PM)  Habits & Providers  Alcohol-Tobacco-Diet     Cigarette Packs/Day: n/a  Physical Exam  General:  Vs reviewed, alert, well-developed, well-nourished, and well-hydrated.   Heart:  normal rate and regular rhythm.   Abdomen:  gravid Extremities:  trace left pedal edema and trace right pedal edema.   Neurologic:  DTRs symmetrical and normal.     Impression & Recommendations:  Problem # 1:  PREGNANT STATE, INCIDENTAL (ICD-V22.2)  30 yo G2P1001 at 36.[redacted] weeks gestation dated by sure LMP.  EDD 07-06-2010 O+, Ab -, Hgb 13.2, Hct 40.3, Plt 335, HgBsAg neg, RPR NR, HIV NR, Rubella immune, sickle cell neg, GC/Chlamydia neg, PAP neg 28 wk labs: Hgb 11.3, hct 34.5, Plt 249, HIV NR, RPR NR Fundal height today measures 36. Discrepency may be provider variance.  Continue to follow closely.  1hrGTT 102  [place] from 4-28 showed bilateral choroid plexus cytys.  Repeat U/S showed resolution of cysts. Quad screen WNL  female gender GBS neg  Orders: Other OB visit- [facility] (OBCK)  Problem # 2:  PREV C/S DELIV DELIV W/WO MENTION ANTPRTM COND (JYN-829.56) seen at [facility] last week for VBAC consent.   Plan for vaginal delivery, consent signed.  See E-chart for dictated OV with Dr. Solon Palm.  Orders: Other OB visit- [facility] (OBCK)  Complete Medication List: 1)  Prenatal Vitamins  .Marland KitchenMarland Kitchen. 1 by mouth qd  Patient Instructions: 1)  Please make an appt in 1 week with Dr. Gomez Cleverly. 2)   Everything looks good.  Please go to [facility] hospital if you have bleeding, contractions, if your water breaks, or the baby isn 't moving well. 3)  Call Dr. Gomez Cleverly when you go to the Hospital: [phone])  .    OB Initial Intake Information    Positive HCG by: self    Race: Hispanic    Marital status: Married  Occupation: none    Education (last grade completed): high school    Number of children at home: 1    [facility] of delivery: [facility]  FOB Information    Husband/Father of baby: Nash Dimmer    FOB occupation waterproofing    Phone: [phone]  Menstrual History    LMP (date): 09/29/2009    LMP - Character: normal    Menarche: 12 years    Menses interval: 28 days    Menstrual flow 5-7 days    Date of positive (+) home preg. test: 11/09/2009   Flowsheet View for Follow-up Visit    Estimated weeks of       gestation:     36 6/7    Weight:     130    Blood pressure:   109 / 74    Nausea/vomiting:   No    Edema:     TrLE    Vaginal bleeding:   no    Vaginal discharge:   no    Fundal height:      36    FHR:       140s    Fetal activity:     yes    Labor symptoms:   no    Taking prenatal vits?   Y    Smoking:  n/a    Next visit:     1 wk    Resident:     Gomez Cleverly    Preceptor:     hale

## 2010-11-21 NOTE — Assessment & Plan Note (Signed)
Summary: PP/MJ   Vital Signs:  Patient profile:   30 year old female Weight:      115.2 pounds Temp:     97.9 degrees F oral Pulse rate:   79 / minute BP sitting:   112 / 79  (left arm) Cuff size:   regular  Vitals Entered By: Garen Grams LPN (September 05, 2010 11:16 AM) CC: postpartum check   Primary Care [place]:  Alvia Grove DO  CC:  postpartum check.  History of Present Illness: 30 yo female her for 6 wk PP visit.  Multiple concerns today: 1. Tooth pain: cracked a tooth and is having pain.  Taking Ibuprofen with improvement. 2. Dysuria:  Present x 5-7 days.  No fever, no increased frequency 3.  PP: s/p c-section due to FTP.  Healing well.  No vaginal bleeding, no abd pain. Denies saddness or depression, has good support at home. Adjusting well to infant.   Breast feeding without problems. Wants to use condoms for [facility].   Habits & Providers  Exercise-Depression-Behavior     Does Patient Exercise: yes     Drug Use: no  Current Problems (verified): 1)  Dysuria  (ICD-788.1) 2)  Dental Pain  (ICD-525.9) 3)  Prev C/s Deliv Deliv W/wo Mention Antprtm Cond  (JYN-829.56) 4)  Pelvic Pain  (ICD-789.09) 5)  Abnormal Finding On Antenatal Screening  (ICD-796.5) 6)  Pregnant State, Incidental  (ICD-V22.2)  Current Medications (verified): 1)  Ibuprofen 600 Mg Tabs (Ibuprofen) .Marland Kitchen.. 1 Pill By Mouth Q6 Hours As Needed Severe Pain 2)  Peridex 0.12 % Soln (Chlorhexidine Gluconate) .Marland KitchenMarland KitchenMarland Kitchen 15ml Swish/spit Two Times A Day  Allergies (verified): No Known Drug Allergies  Past History:  Past Medical History: Last updated: 05/10/2010 Unremarkable  Family History: Last updated: 05/10/2010 unremarkable  Social History: Last updated: 09/05/2010 Married Never Smoked Lives in [place] with husband and 2 children [name] and [place]leton Common, both my patients.) Alcohol use-no Drug use-no Regular exercise-yes  Risk Factors: Smoking Status: never (06/28/2010) Packs/Day: n/a (07/17/2010)  Past  Surgical History: Caesarean section x 2  Social History: Married Never Smoked Lives in [place] with husband and 2 children ([place]ud[place] and [place], both my patients.) Alcohol use-no Drug use-no Regular exercise-yes Drug Use:  no Does Patient Exercise:  yes  Review of Systems       see hpi  Physical Exam  General:  Vs reviewed, alert, well-developed, well-nourished, and well-hydrated.   Mouth:  tooth on bottom left is cracked.  No abscesses noted.  Lungs:  normal respiratory effort.   Heart:  normal rate and regular rhythm.   Abdomen:  soft, non-tender, normal bowel sounds, no distention, and no masses.  C-section scar healed well Extremities:  No clubbing, cyanosis, edema, or deformity noted with normal full range of motion of all joints.   Psych:  Cognition and judgment appear intact. Alert and cooperative with normal attention span and concentration. No apparent delusions, illusions, hallucinations   Impression & Recommendations:  Problem # 1:  DYSURIA (ICD-788.1) UA and culture to evaluate see pt instructions Orders: Urine Culture-[facility] ([id number]) Urinalysis-[facility] (00000) Postpartum visit- [facility] ([id number])  Problem # 2:  DENTAL PAIN (ICD-525.9) Ibuprofen and peridex Referr to dentist Orders: Dental Referral (Dentist) Postpartum [facility]- [facility] ([id number])  Problem # 3:  POSTPARTUM CARE&EXAMINATION OF LACTATING MOTHER (ICD-V24.1)  Doing well, reviewed options for contraception, would like to use condoms only. No depression or blues. C-section site healed well. Advised pt that if she has another baby she will need C-section, she has  failed to progress x 2.   see pt instructions  [id number]  Complete Medication List: 1)  Ibuprofen 600 Mg Tabs (Ibuprofen) .Marland Kitchen.. 1 pill by mouth q6 hours as needed severe pain 2)  Peridex 0.12 % Soln (Chlorhexidine gluconate) .Marland KitchenMarland KitchenMarland Kitchen 15ml swish/spit two times a day  Patient Instructions: 1)  Good to see you today. 2)  Let's check  your urine today to check and see if you have an infection. 3)  For your dental pain, use Ibuprofen as needed.   4)  Use the rinse twice a day for 30 seconds each time. 5)  I will get you an appointment to see a dentist as soon as possible. 6)  For [name], please call the [facility] Dept as the nurses talked to you about for setting up a dental appointment for him. Prescriptions: PERIDEX 0.12 % SOLN (CHLORHEXIDINE GLUCONATE) 15mL swish/spit two times a day  #328mL x 1   Entered and Authorized by:   Alvia Grove DO   Signed by:   Alvia Grove DO on 09/05/2010   Method used:   Print then Give to Patient   RxID:   [id number] IBUPROFEN 600 MG TABS (IBUPROFEN) 1 pill by mouth Q6 hours as needed severe pain  #90 x 1   Entered and Authorized by:   Alvia Grove DO   Signed by:   Alvia Grove DO on 09/05/2010   Method used:   Print then Give to Patient   RxID:   [id number]    Orders Added: 1)  Urine Culture-[facility] [[id number]] 2)  Urinalysis-[facility] [00000] 3)  Dental Referral [Dentist] 4)  Postpartum visit- [facility] [[id number]]    Laboratory Results   Urine Tests  Date/Time Received: September 05, 2010 11:31 AM  Date/Time Reported: September 05, 2010 12:25 PM   Routine Urinalysis   Color: yellow Appearance: Clear Glucose: negative   (Normal Range: Negative) Bilirubin: negative   (Normal Range: Negative) Ketone: trace (5)   (Normal Range: Negative) Spec. Gravity: >=1.030   (Normal Range: 1.003-1.035) Blood: negative   (Normal Range: Negative) pH: 6.0   (Normal Range: 5.0-8.0) Protein: negative   (Normal Range: Negative) Urobilinogen: 1.0   (Normal Range: 0-1) Nitrite: negative   (Normal Range: Negative) Leukocyte Esterace: negative   (Normal Range: Negative)    Comments: urine sent for culture ...............test performed by......Marland KitchenBonnie A. [place], MLS (ASCP)cm

## 2010-11-21 NOTE — Assessment & Plan Note (Signed)
Summary: ob visit/eo   Vital Signs:  Patient profile:   30 year old female Weight:      131 pounds BP sitting:   130 / 91  Habits & Providers  Alcohol-Tobacco-Diet     Cigarette Packs/Day: n/a   Impression & Recommendations:  Problem # 1:  PREGNANT STATE, INCIDENTAL (ICD-V22.2) BP elevated to 130/91 today. Denies PIH symptoms including RUQ pain, nausea, headache, LE edema, vision change, urinary changes. Urine negative for protein as above. Patient left prior to staff being able to recheck BP. Follow at next appointment.   30 yo G2P1001 at 39.[redacted] weeks gestation dated by sure LMP.  EDD 07-06-2010 O+, Ab -, Hgb 13.2, Hct 40.3, Plt 335, HgBsAg neg, RPR NR, HIV NR, Rubella immune, sickle cell neg, GC/Chlamydia neg, PAP neg 28 wk labs: Hgb 11.3, hct 34.5, Plt 249, HIV NR, RPR NR Fundal height today measures 38.5.   1hrGTT 102  [place] from 4-28 showed bilateral choroid plexus cytys.  Repeat U/S showed resolution of cysts. Quad screen WNL  female gender, plans breast feeding, epidural for pain management GBS neg  Follow up in one week with Dr. Gomez Cleverly. (Dr. Denyse Amass is back up)  Orders: UA Glucose/Protein-[facility] (81002) Other OB visit- [facility] (OBCK)  Complete Medication List: 1)  Prenatal Vitamins  .Marland KitchenMarland Kitchen. 1 by mouth qd  Patient Instructions: 1)  Haga una sita por Elisabeth Most con Dr. Gomez Cleverly  2)  Si tiene sangrado vaginal, o si sospecha que se le ha roto la fuente (chorro fuerte o un goteo leve de la vagina) o si tiene English as a second language teacher que vienen mas de cada cinco minutos por dos horas seguidas, por favor presentese al Narda Rutherford de Emergencia de [facility]. 3)  Cada manana y noche enfoquese en los movimientos del bebe.  Si siente por lo  menos cinco movimientos en una hora, no tiene que seguir contando.  Si no llega a cinco movimientos en una hora, siga contando por otra hora.  Si no llega a diez movimientos en estas dos horas, debe presentarse al [employer] de Emergencia de [facility] o llamar a Event organiser.     Flowsheet View for Follow-up Visit    Estimated weeks of       gestation:     39 5/7    Weight:     131    Blood pressure:   130 / 91    Urine Protein:     negative    Urine Glucose:   negative    Headache:     No    Nausea/vomiting:   No    Edema:     0    Vaginal bleeding:   no    Vaginal discharge:   no    Fundal height:      38.5    FHR:       140's    Fetal activity:     yes    Labor symptoms:   few ctx 2-3 times per day    Taking prenatal vits?   Y    Smoking:     n/a    Next visit:     1 wk    Resident:     Wallene Huh   Laboratory Results   Urine Tests  Date/Time Received: July 04, 2010 3:50 PM  Date/Time Reported: July 04, 2010 4:01 PM   Routine Urinalysis   Glucose: negative   (Normal Range: Negative) Protein: negative   (Normal Range: Negative)    Comments: ..............Marland Kitchen  test performed by......Marland KitchenBonnie A. [place], MLS (ASCP)cm

## 2010-12-04 ENCOUNTER — Encounter: Payer: Self-pay | Admitting: *Deleted

## 2011-01-04 LAB — COMPREHENSIVE METABOLIC PANEL
ALT: 18 U/L (ref 0–35)
AST: 20 U/L (ref 0–37)
Albumin: 3 g/dL — ABNORMAL LOW (ref 3.5–5.2)
Alkaline Phosphatase: 257 U/L — ABNORMAL HIGH (ref 39–117)
Calcium: 9.2 mg/dL (ref 8.4–10.5)
Creatinine, Ser: 0.46 mg/dL (ref 0.4–1.2)
GFR calc non Af Amer: >60 mL/min (ref >60)
Glucose, Bld: 93 mg/dL (ref 70–99)
Total Bilirubin: 0.1 mg/dL — ABNORMAL LOW (ref 0.3–1.2)

## 2011-01-04 LAB — CBC
HCT: 31.8 % — ABNORMAL LOW (ref 36.0–46.0)
MCH: 31 pg (ref 26.0–34.0)
MCHC: 34.4 g/dL (ref 30.0–36.0)
MCHC: 34.8 g/dL (ref 30.0–36.0)
MCV: 87.8 fL (ref 78.0–100.0)
MCV: 89.4 fL (ref 78.0–100.0)
RBC: 4.26 MIL/uL (ref 3.87–5.11)
RDW: 13.9 % (ref 11.5–15.5)
WBC: 9.7 10*3/uL (ref 4.0–10.5)

## 2011-01-04 LAB — URINALYSIS, ROUTINE W REFLEX MICROSCOPIC
Hgb urine dipstick: NEGATIVE
Nitrite: NEGATIVE
Specific Gravity, Urine: <1.005 — ABNORMAL LOW (ref 1.005–1.030)
pH: 7 (ref 5.0–8.0)

## 2011-01-08 LAB — GLUCOSE, CAPILLARY: Glucose-Capillary: 102 mg/dL — ABNORMAL HIGH (ref 70–99)

## 2011-05-29 ENCOUNTER — Encounter: Payer: Self-pay | Admitting: Family Medicine

## 2011-05-29 ENCOUNTER — Ambulatory Visit (INDEPENDENT_AMBULATORY_CARE_PROVIDER_SITE_OTHER): Payer: Self-pay | Admitting: Family Medicine

## 2011-05-29 VITALS — BP 116/73 | HR 85 | Temp 98.0°F | Wt 102.0 lb

## 2011-05-29 DIAGNOSIS — J069 Acute upper respiratory infection, unspecified: Secondary | ICD-10-CM

## 2011-05-29 NOTE — Progress Notes (Signed)
  Subjective:     Laurie Barnett is a 30 y.o. female who presents for evaluation of sore throat. Associated symptoms include low grade fevers, chills, sore throat and swollen glands. Onset of symptoms was 2 days ago, and have been unchanged since that time. She is drinking plenty of fluids. She has had a recent close exposure to someone with proven streptococcal pharyngitis.  The following portions of the patient's history were reviewed and updated as appropriate: allergies, current medications, past family history, past medical history, past social history, past surgical history and problem list.  Review of Systems A comprehensive review of systems was negative.    Objective:    BP 116/73  Pulse 85  Temp(Src) 98 F (36.7 C) (Oral)  Wt 102 lb (46.267 kg) General appearance: alert and cooperative Eyes: conjunctivae/corneas clear. PERRL, EOM's intact. Fundi benign. Ears: normal TM's and external ear canals both ears Nose: Nares normal. Septum midline. Mucosa normal. No drainage or sinus tenderness., mild congestion Throat: abnormal findings: mild oropharyngeal erythema Neck: mild anterior cervical adenopathy, supple, symmetrical, trachea midline and thyroid not enlarged, symmetric, no tenderness/mass/nodules Lungs: clear to auscultation bilaterally Heart: regular rate and rhythm, S1, S2 normal, no murmur, click, rub or gallop Abdomen: soft, non-tender; bowel sounds normal; no masses,  no organomegaly    Assessment:    URI Viral pharyngitis.    Plan:    Use of OTC analgesics recommended as well as salt water gargles. Follow up as needed.

## 2011-09-26 IMAGING — US US OB COMP +14 WK
1 series · 14 of 28 positions shown · non-contrast
Comparison: none

OBSTETRICAL ULTRASOUND:
 This ultrasound exam was performed in the [HOSPITAL] Ultrasound Department.  The OB US report was generated in the AS system, and faxed to the ordering physician.  This report is also available in [HOSPITAL]?s Access[employer] and in [REDACTED] PACS.

[Series 1: us ob detail +14 wk · 0.19mm/px · 14 of 81 slices shown]
[im 3/81]
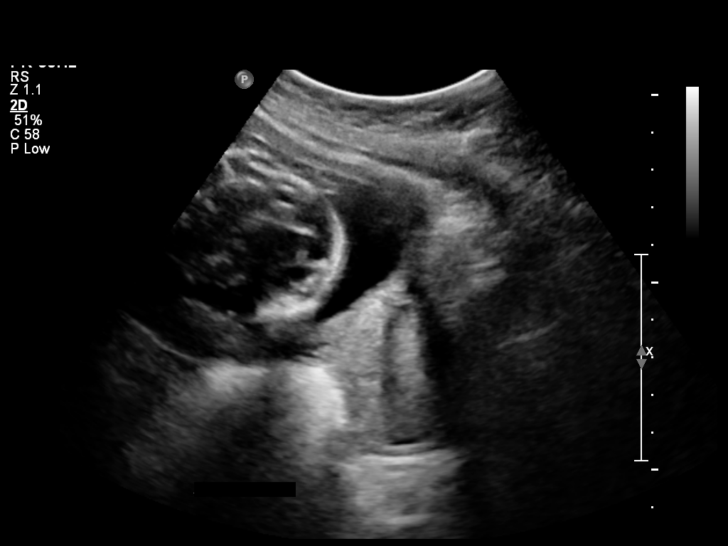
[im 9/81]
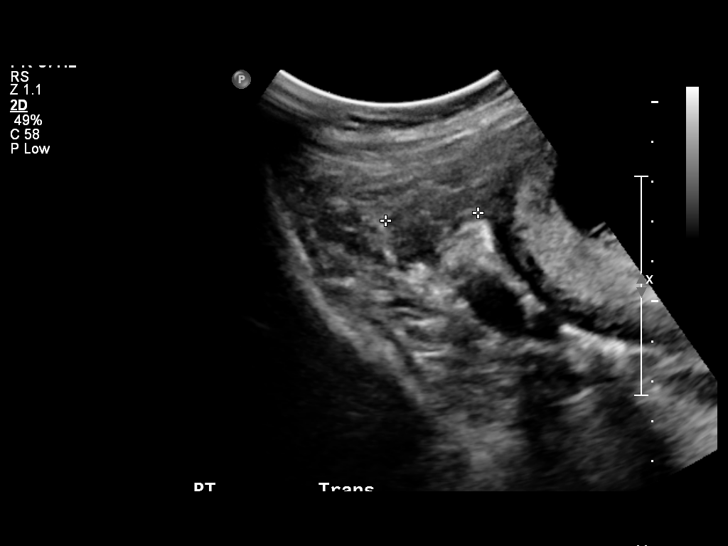
[im 15/81]
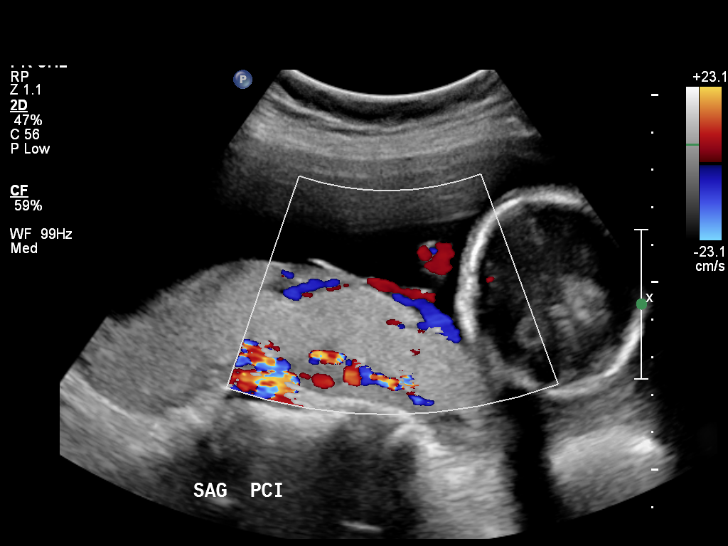
[im 21/81]
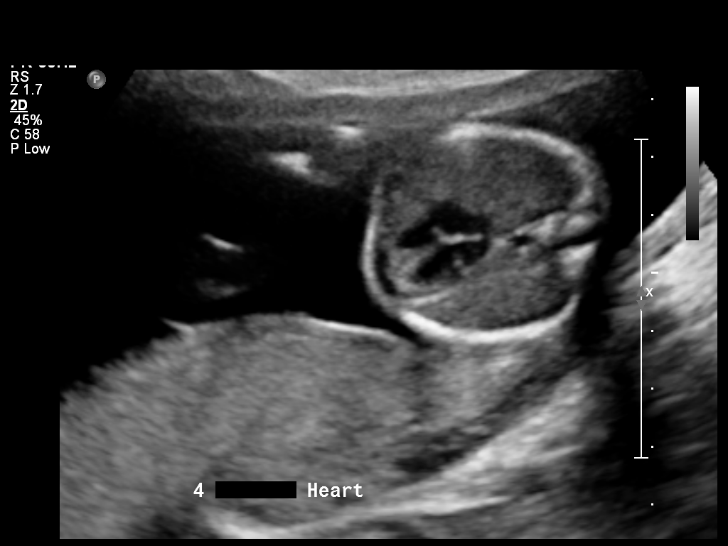
[im 27/81]
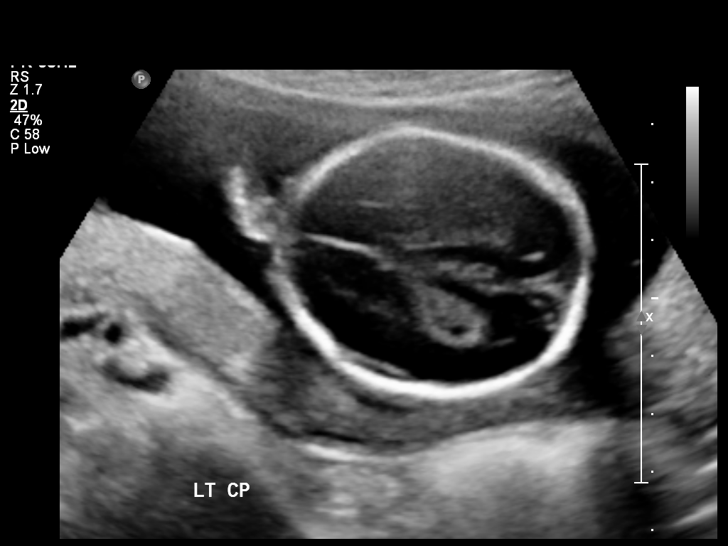
[im 33/81]
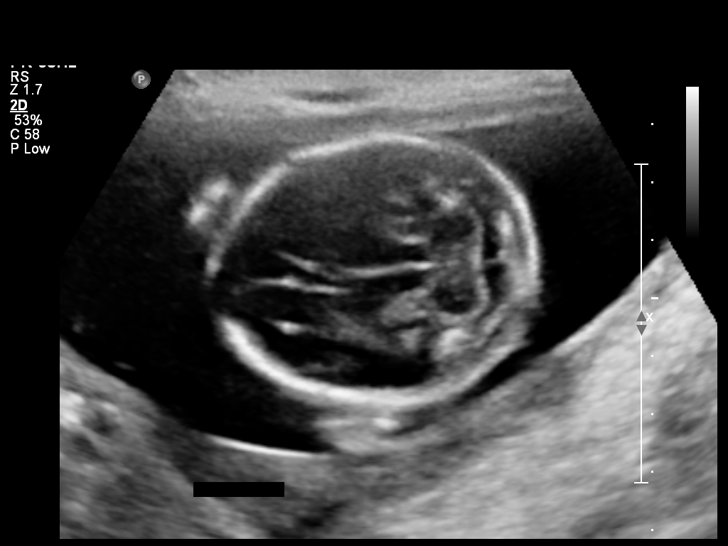
[im 39/81]
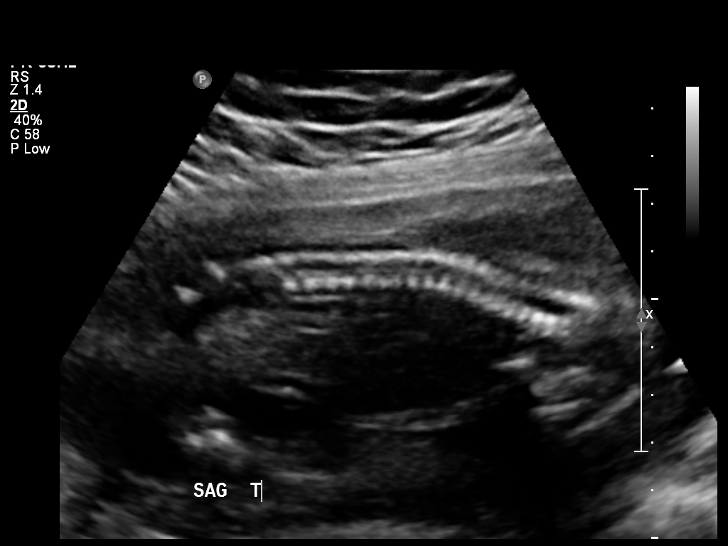
[im 45/81]
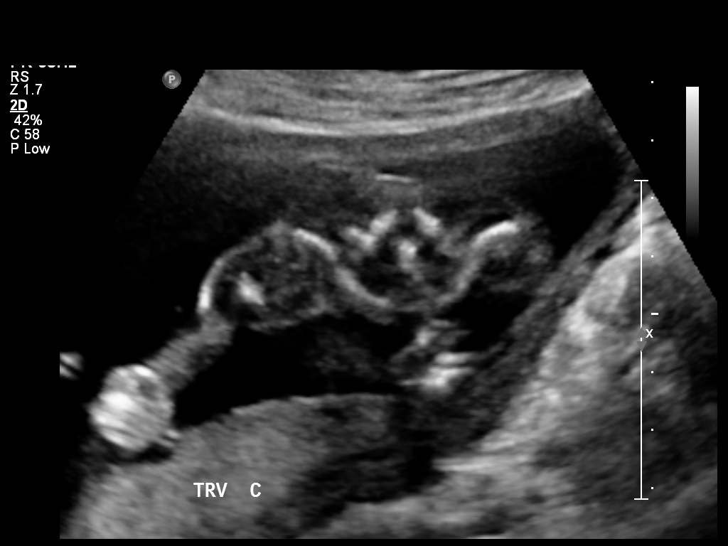
[im 51/81]
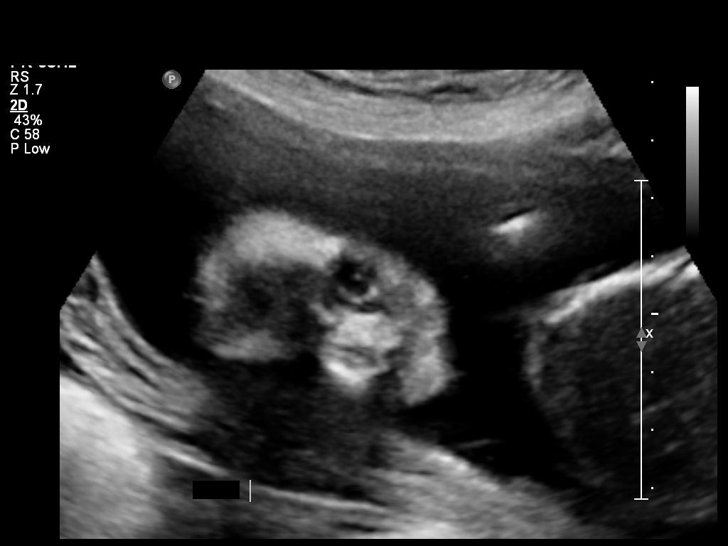
[im 57/81]
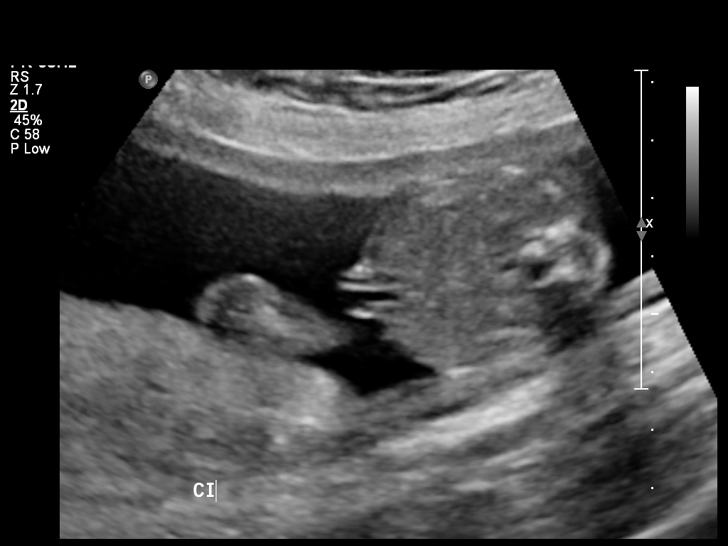
[im 63/81]
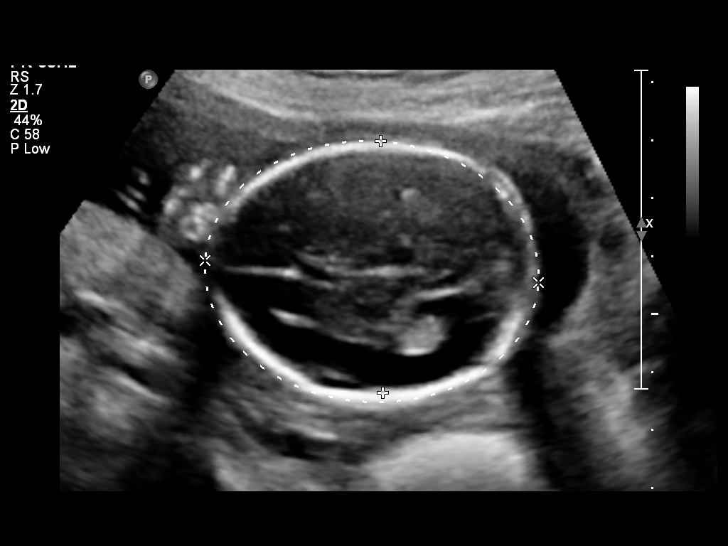
[im 69/81]
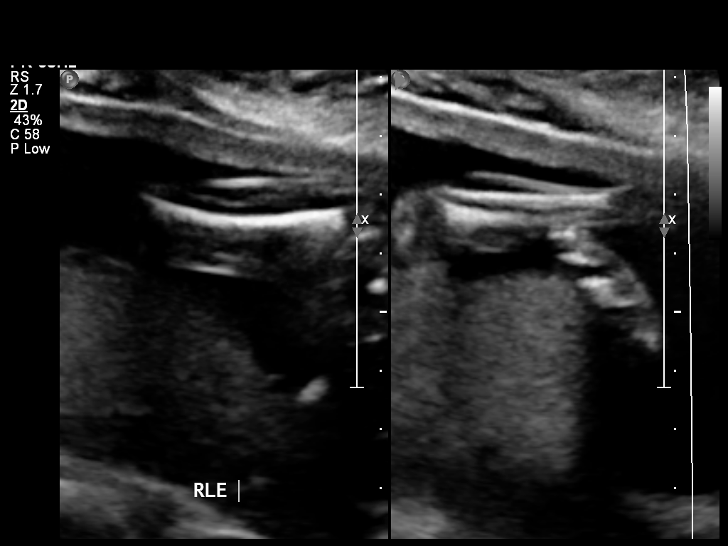
[im 75/81]
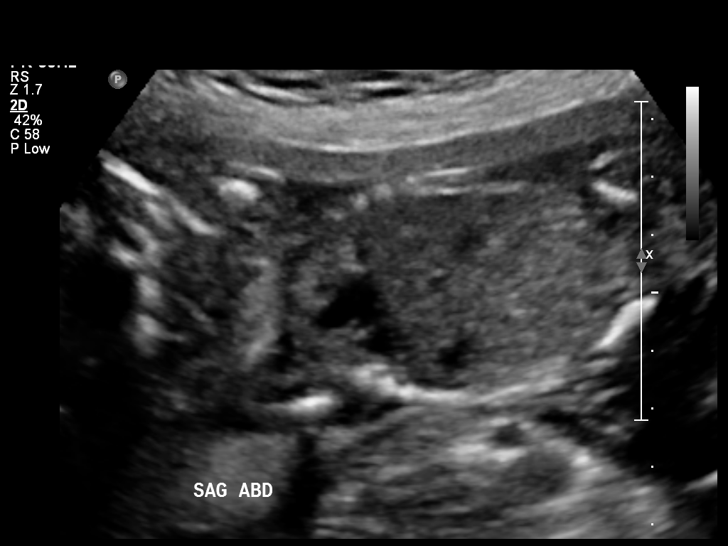
[im 81/81]
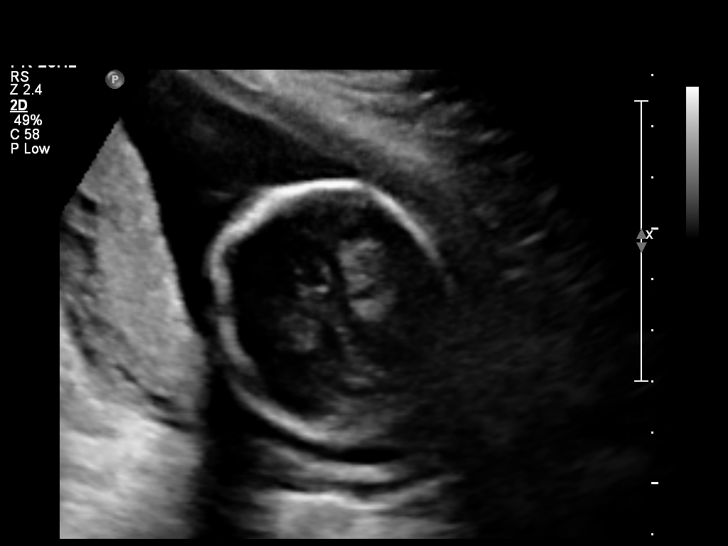

[14 of 28 positions shown; findings below may reference images not displayed]

IMPRESSION: See AS Obstetric US report.

## 2011-09-28 ENCOUNTER — Other Ambulatory Visit: Payer: Self-pay | Admitting: Family Medicine

## 2011-09-28 ENCOUNTER — Encounter: Payer: Self-pay | Admitting: Family Medicine

## 2011-09-28 ENCOUNTER — Other Ambulatory Visit: Payer: Self-pay

## 2011-09-28 ENCOUNTER — Ambulatory Visit (INDEPENDENT_AMBULATORY_CARE_PROVIDER_SITE_OTHER): Payer: Self-pay | Admitting: Family Medicine

## 2011-09-28 VITALS — BP 106/69 | Temp 98.3°F | Wt 107.4 lb

## 2011-09-28 DIAGNOSIS — Z3480 Encounter for supervision of other normal pregnancy, unspecified trimester: Secondary | ICD-10-CM

## 2011-09-28 DIAGNOSIS — Z331 Pregnant state, incidental: Secondary | ICD-10-CM

## 2011-09-28 DIAGNOSIS — M549 Dorsalgia, unspecified: Secondary | ICD-10-CM | POA: Insufficient documentation

## 2011-09-28 DIAGNOSIS — N898 Other specified noninflammatory disorders of vagina: Secondary | ICD-10-CM | POA: Insufficient documentation

## 2011-09-28 LAB — POCT WET PREP (WET MOUNT)
Clue Cells Wet Prep HPF POC: NEGATIVE
WBC, Wet Prep HPF POC: >20

## 2011-09-28 NOTE — Assessment & Plan Note (Signed)
Wet prep, GC/Chl today. Prenatal labs drawn. Will treat pending cultures. Infectious as well as OB red flags discussed.

## 2011-09-28 NOTE — Progress Notes (Signed)
Pt is here for a work in visit. For low back pain and vaginal discharge.   Pt states that this has been going on for 2-3 weeks. Pain is mainly in back/lumbar area and is assd with prolonged standing and physical activity. No dysuria, increased urinary frequency. No fevers. No noted hematuria. Pt also reports of vaginal discharge. Discharge in mainly thin in nature. Non foul smelling. No abd pain. + Nausea assd with morning sickness. No hx/o STDs. Pt states that she has been with one partner. No vaginal bleeding.  From an OB standpoint pt states that last LMP was in august, but she is unsure.  PE:  Gen: up in chair, NAD HEENT: NCAT, EOMI, TMs clear bilaterally CV: RRR, no murmurs auscultated PULM: CTAB, no wheezes, rales, rhoncii ABD: S/NT/+ bowel sounds, gravid abdomen GU: normal external genitalia, minimal vaginal discharge, no CMT EXT: 2+ peripheral pulses MSK: mild lumbar TTP  Back Pain: MSK in etiology. Discussed adequate hydration and avoiding any heavy/strenuous lifting.  Vaginal Discharge: Wet prep, GC/Chl today. Prenatal labs drawn. Will treat pending cultures. Infectious as well as OB red flags discussed.  OB: Prenatal labs drawn. Fetal mov't was auscultated on doppler, but could not hear discernible fetal heart tones. Will set up for early u/s for dating.

## 2011-09-28 NOTE — Assessment & Plan Note (Signed)
MSK in etiology. Discussed adequate hydration and avoiding any heavy/strenuous lifting.

## 2011-09-28 NOTE — Assessment & Plan Note (Signed)
Prenatal labs drawn. Fetal mov't was auscultated on doppler, but could not hear discernible fetal heart tones. Will set up for early u/s for dating.

## 2011-09-28 NOTE — Progress Notes (Signed)
PRENATAL LABS DONE TODAY Branndon Tuite 

## 2011-09-28 NOTE — Patient Instructions (Signed)
Dolor de Merchandiser, retail  (Back Pain in Pregnancy)  El dolor de espalda es habitual durante el embarazo. Ocurre en aproximadamente la mitad de todos los [place]. Es importante para usted y su beb que permanezca activa durante el [place].Si siente que Chief Technology Officer de espalda es lo que no le permite mantenerse activa o dormir bien, Scientist, clinical (histocompatibility and immunogenetics) a su mdico. La causa del dolor de espalda puede deberse a varios factores relacionados con los cambios durante el [place].Afortunadamente, excepto que haya tenido problemas de espalda antes del [place], es probable que el dolor mejore despus del [place]. El dolor lumbar por lo general ocurre entre el quinto y sptimo mes del Psychiatrist. Sin embargo, puede ocurrir [employer] primeros meses. Otros factores que aumentan el riesgo son:   Problemas previos en la espalda.   Lesiones en la espalda.   Tener gemelos o embarazos mltiples.   Tos persistente.   El estrs.   Movimientos repetitivos relacionados con Kathie Dike.   Enfermedad muscular o de la columna vertebral en la espalda.   Antecedentes familiares de problemas de espalda, rotura (hernia) de discos u osteoporosis.   Depresin, ansiedad y crisis de [place].  CAUSAS   En las embarazadas, el cuerpo produce una hormona llamada relaxina. Esta hormona hace que los ligamentos que conectan la zona lumbar y los huesos del pubis sean ms flexibles. Esta flexibilidad permite que el beb nazca con ms facilidad. Cuando los ligamentos estn relajados, los msculos tienen que trabajar ms para apoyar la espalda. El dolor en la espalda puede deberse al cansancio muscular. El dolor tambin puede tener su causa en la irritacin de los tejidos de a espalda que se irritan ya que estn recibiendo menos apoyo.   A medida que el beb crece, ejerce presin [employer] nervios y los vasos sanguneos de la pelvis. Esto causa dolor de espalda.   A medida que el beb crece y [address] durante el  [place], el tero presiona los msculos del estmago hacia adelante y [place] su centro de gravedad. Esto hace que los msculos de la espalda deban trabajar ms para mantener una buena [place].  SNTOMAS  Dolor lumbar durante el embarazo Generalmente se produce en la zona o por arriba de la cintura en el centro de la espalda. Puede haber dolor y entumecimiento que se irradia hacia la pierna o el pie. Es similar al dolor de espalda baja experimentada por las mujeres no embarazadas. Por lo general, aumenta al [employer] de pie o sentada por largos perodos de [place], o con levantamientos repetitivos Tambin puede haber sensibilidad en los msculos en la zona superior de la espalda .  Dolor plvico posterior Environmental consultant en la parte posterior de la pelvis es ms frecuente que el dolor lumbar en el embarazo. Se trata de un dolor profundo que se siente a un lado en la cintura, o a travs del cxis (sacro), o en ambos lugares. Puede sentir dolor en uno o ambos lados Este dolor tambin puede sentirse en las nalgas y el dorso de los muslos Tambin puede haber dolor pbico y en la ingle. El dolor no se mejora rpidamente con el reposo, y [place] puede haber rigidez matutina. Muchas actividades pueden causarlo. Un buen estado fsico antes y [address] puede o no prevenir este problema. Las contracciones del parto suelen aparecer cada 1 a 2 minutos, tienen una duracin de aproximadamente 1 minuto, e implica una sensacin de empujar o presin en la pelvis. Sin  embargo, si usted est a trmino con Firefighter, Chief Technology Officer constante en la zona lumbar puede indicar el comienzo de un parto prematuro, y usted debe ser consciente de ello.  DIAGNSTICO  No se deben tomar radiografas de la [employer] las primeras 12 a 14 semanas del embarazo y durante el resto del Psychiatrist, slo cuando sea absolutamente necesario. La resonancia magntica no emite radiacin y es un estudio seguro durante el Psychiatrist.  Pero tambin se deben hacer solamente cuando sea absolutamente necesario.  INSTRUCCIONES PARA EL CUIDADO EN EL HOGAR   Realice actividad fsica segn las indicaciones del mdico. El ejercicio es la manera ms eficaz para prevenir o tratar Chief Technology Officer de espalda. Si tiene un problema en la espalda, es especialmente importante evitar los deportes que requieran de movimientos corporales rpidos. La natacin y las caminatas son las mejores [address].   No permanezca sentada o de pie en el mismo lugar durante largos perodos.   No use tacos altos.   Sintese en la silla con una buena postura. Use una almohada en su espalda baja si es necesario. Asegrese de que su cabeza descansa sobre sus hombros y no est colgando hacia delante.   Trate de dormir de lado, de preferencia el lado izquierdo, con una o [employer] piernas. Si est dolorida despus de una noche de descanso, la cama puede ser [employer].Trate de colocar una tabla entre el colchn y el somier.   Prstele atencin a su cuerpo cuando se levante.Si siente dolor,pida ayuda o trate de doblar las rodillas ms para [employer] de las piernas en lugar de los msculos de la espalda. Pngase en cuclillas al levantar algo del suelo. No se doble.   Consuma una dieta saludable. Trate de aumentar de peso dentro de las recomendaciones de su mdico.   Utilice compresas de calor o fro de 3 a 4 veces al da durante 15 minutos para Primary school teacher.   Solo tome medicamentos que se pueden comprar sin receta o recetados para Chief Technology Officer, Dentist o fiebre, como le indica el mdico.  Dolor de espaldas repentino (agudo).  Haga reposo en cama slo en caso de los episodios ms extremos y agudos de Engineer, mining. El reposo prolongado en cama de ms de 48 horas agravar su trastorno.   El hielo es muy efectivo en los problemas agudos.   Ponga el hielo en una bolsa plstica.   Colquese una toalla entre la piel y la bolsa de hielo.   Deje  el hielo durante 10 a 20 minutos cada 2 horas o segn lo nesecite, mientras se encuentre despierta.   Las compresas de calor durante 30 minutos antes de las actividades tambin puede ayudar.  Dolor crnico en la espalda. Consulte a su mdico si el dolor es continuo. El mdico podr ayudarla o derivarla para que realice los ejercicios y trabajos de fortalecimiento apropiados. Con un buen entrenamiento fsico, podr evitar la mayor parte de los [place]. En algunos casos, la causa es un problema ms grave. Debe ser controlada inmediatamente si aparecen nuevos problemas. El mdico tambin podr recomendar:   Una faja de maternidad.   Un arns elstico.   Un cors para la espalda.   Un masajista o acupuntura.  SOLICITE ATENCIN MDICA SI:   No puede Careers information officer de sus actividades diarias, an tomando los medicamentos para Psychologist, occupational.   Beverlee Nims ser derivada a un fisioterapeuta o quiroprxico.   Beverlee Nims intentar con  acupuntura.  SOLICITE ATENCIN MDICA DE INMEDIATO SI:   Siente entumecimiento, hormigueo, debilidad o problemas con el uso de los brazos o las piernas.   Siente un dolor de espalda muy intenso que no se alivia con medicamentos.   Tiene modificaciones repentinas en el control de la vejiga o el intestino.   Aumenta el dolor en otras partes del cuerpo.   Siente que le falta el aire, se marea o sufre un [place].   Tiene nuseas, vmitos o sudoracin.   Siente un dolor en la espalda similar al del [place] de [place].   Cuando aparece [employer], rompe la bolsa de aguas o tiene un sangrado vaginal.   El dolor o el adormecimiento se extienden hacia la pierna.   El dolor aparece despus de una cada.   Siente dolor de un solo lado. Podra tener clculos renales.   Observa sangre en la orina. Podra tener una infeccin en la vejiga o clculos renales.   Siente dolor y aparecen ronchas. Podra tener culebrilla.  El dolor de espalda es bastante  frecuente durante el embarazo pero no debe aceptarse slo como parte del [place]. Siempre debe tratarse lo ms rpidamente posible. Har que su embarazo sea lo ms placentero posible.  Document Released: 06/20/2011 [facility] Patient Information 2012 [place], [place]. Deshidratacin (Dehydration) La deshidratacin es la disminucion del agua y lquidos en el organismo a un nivel inferior del requerido para un funcionamiento correcto.  CAUSAS La deshidratacin se produce cuando hay una prdida excesiva de lquidos en el organismo o cuando la prdida normal no se repone.  Hay prdida de lquidos en los casos de vmitos, [place], sudoracin excesiva, eliminacin excesiva de [place] o prdida de lquidos en los pulmones (como ocurre en los casos de fiebre elevada o en pacientes ventilados artificialmente)   El reemplazo inadecuado de lquidos puede ocurrir cuando hay nuseas, disminucin del apetito debido a [place] enfermedad o Engineer, mining de garganta o de boca.  SNTOMAS DESHIDRATACIN LEVE  Sed (los bebs y los nios pequeos pueden no ser capaces de decir que tienen sed).   Labios resecos.   Sequedad leve de la mucosa bucal.  DESHIDRATACIN MODERADA  Intensa sequedad de la mucosa bucal.   Ojos hundidos.   Fontanelas (zonas blandas) hundidas en la cabeza del beb.   La piel no vuelve rpidamente a su lugar cuando se suelta luego de pellizcarla ligeramente.   Disminucin de la cantidad [place].   Disminucin de la cantidad de lgrimas.  DESHIDRATACIN GRAVE  Pulso rpido y dbil (ms de 100 por minuto en reposo).   Manos y pies fros.   Prdida de la capacidad para transpirar, independientemente del calor y la temperatura.   Respiracin rpida.   Labios azulados   Confusin, aletargamiento, dificultad para despertar.   Poca produccin de [place].   Falta de lgrimas  DIAGNSTICO El profesional diagnosticar deshidratacin basndose en los sntomas (indicados ms [place]) y el examen  fsico. Las pruebas de sangre y [place] ayudarn a Astronomer el diagnstico. La evaluacin diagnstica suele identificar tambin las causas de la deshidratacin. PREVENCIN El organismo depende de un correcto equilibrio de lquidos y Customer service manager (sales como las de sodio y Government social research officer) para un funcionamiento normal. Es muy importante una adecuada ingesta de lquidos en presencia de una enfermedad u otras causas estresantes (como en la prctica de un ejercicio extremo).  TRATAMIENTO  La deshidratacin leve se puede tratar sin consultar al mdico, siempre que no empeore. Comunquese con el profesional que lo asiste cuando se trate  de bebs y ancianos, an en los casos de deshidratacin leve.   En los adolescentes y los adultos que sufren una deshidratacin moderada, una atencin esmerada en la casa (como se indica abajo) ser suficiente. Es aconsejable Psychologist, prison and probation services telefnico con el profesional que lo asiste. Los nios menores de 10 aos con deshidratacin moderada deben ser vistos por un profesional.   Si o usted o su hijo est gravemente deshidratado, debe concurrir a un hospital para Pensions consultant. La administracin de lquidos por va intravenosa (IV) revierte rpidamente la deshidratacin, y con frecuencia salva la vida de nios pequeos, bebs y ancianos.  INSTRUCCIONES PARA EL CUIDADO DOMICILIARIO Deber tomar con frecuencia pequeas cantidades de lquidos. Una gran cantidad de agua puede no tolerarse. El agua sola puede hacer dao a los bebs y [place]. Las soluciones de rehidratacin oral estn disponibles en las farmacias y [place]. Ellas reponen agua y los electrolitos ms importantes en proporciones adecuadas. Las bebidas deportivas no son tan efectivas y pueden ser nocivas debido al contenido de azcar que puede [employer] diarrea.  Como directiva general para los nios, reponga toda nueva prdida de lquidos ocasionada por diarrea o vmitos con SRO o lquidos claros del  siguiente modo:   Si el nio pesa10 kg o menos (22 libras o menos), ofrzcale 60-120 ml ( -1/2 taza o 2 - 4 onzas) de SRO en cada episodio de deposicin diarreica o vmito.   Si el nio pesa 10 Kg o ms (22 libras o ms), ofrzcale 120-240 ml (1/2 - 1 taza o 4-8 onzas) de SRO en cada episodio de vmito o diarrea.   Si el nio tiene vmitos, ser til Ship broker de SRO en 5 ml (1 cucharadita) cada 5 minutos, y luego aumentar segn la tolerancia.   Mientras se corrige la Tax adviser. Sin embargo, los alimentos con alto contenido de Programme researcher, broadcasting/film/video porque pueden [employer] diarrea. Deben evitarse las bebidas carbonatadas en grandes cantidades, jugos, postres de gelatina y otros lquidos altamente azucarados.   Luego de [employer] deshidratacin, se le pueden dar al [employer] lquidos. El nio deber beber con frecuencia pequeas cantidades de lquido y aumentarlos segn su tolerancia. Los nios debern beber gran cantidad de lquido para Pharmacologist la orina de tono claro o color amarillo plido.   Los adultos debern comer normalmente y beber ms lquidos que lo habitual. Beba con frecuencia pequeas cantidades de lquido y aumente segn su tolerancia. Hay que beber gran cantidad de lquido para mantener la orina de tono claro o color amarillo plido. Los caldos, el t [place], las bebidas de lima limn (sin burbujas) y los lquidos de bajas caloras incorporan lquidos y Customer service manager.   Evite:   Gaseosas   Jugos.   Lquidos muy calientes o fros   Bebidas con cafena.   Alimentos con alto contenido graso.   Alcohol.   Tabaco   Comer demasiado a la vez   Postres de Network engineer.   Los probiticos son cultivos activos de bacterias benficas. Pueden ayudar a disminuir la diarrea en adultos. Los probiticos puede encontrarse en el yogur con cultivos activos y en suplementos.   Lave bien sus manos para evitar que los grmes  (bacterias) o los virus se diseminen.   No son recomendables los medicamentos antidiarreicos en bebs y nios.   Slo tome medicamentos de Sales promotion account executive o prescriptos para Primary school teacher, las molestias o bajar la fiebre segn las indicaciones de su mdico. No le  administre aspirina a su nio porque puede causarle el [place].   Para adultos con deshidratacin, pregunte a su mdico si debe continuar con los medicamentos prescriptos y de [place].   Si el mdico le ha dado fecha para una visita de control, es importante que concurra. No cumplir con este control puede dar como resultado que el dao o la discapacidad sean permanentes. Si tiene problemas para asistir al control, deber [employer] establecimiento para recibir asesoramiento.  SOLICITE ATENCIN MDICA DE INMEDIATO SI:  Usted o su hijo no pueden retener lquidos u otros sntomas o trastornos empeoran a Designer, industrial/product.   Aparecen vmitos o diarrea, o se vuelven persistentes.   Hay vmitos de sangre o bilis (material verde)   Hay sangre en la materia fecal o sta es de aspecto negro alquitranado.   No hay emisin de [place] durante 6 a 8 horas o se elimina una pequea cantidad de [place].   Aparece dolor abdominal, o ste aumenta o se localiza.   Usted o su nio tienen una temperatura oral de ms de 102 F (38.9 C) y no puede controlarla con medicamentos.   Su beb tiene ms de 3 meses y su temperatura rectal es de 102 F (38.9 C) o ms.   Su beb tiene 3 meses o menos y su temperatura rectal es de 100.4 F (38 C) o ms.   Usted o su hijo presentan debilidad excesiva, mareos, lipotimia o sed extrema.   Usted o su nio presentan una erupcin, contractura en el cuello, cefalea intensa, irritabilidad o tiene dificultades para despertarlo.  ASEGRESE QUE:  Comprende estas instrucciones.   Controlar su enfermedad.   Solicitar ayuda inmediatamente si no mejora o si empeora.  Document  Released: 10/08/2005 Document Revised: 04/23/2011 [facility] Patient Information 2012 [place], [place].

## 2011-09-29 LAB — OBSTETRIC PANEL
Antibody Screen: NEGATIVE
Hepatitis B Surface Ag: NEGATIVE
Lymphocytes Relative: 24 % (ref 12–46)
MCH: 28.9 pg (ref 26.0–34.0)
MCV: 86.8 fL (ref 78.0–100.0)
RBC: 4.33 MIL/uL (ref 3.87–5.11)
Rh Type: POSITIVE
WBC: 5 10*3/uL (ref 4.0–10.5)

## 2011-09-29 LAB — SICKLE CELL SCREEN: Sickle Cell Screen: NEGATIVE

## 2011-09-29 LAB — HIV ANTIBODY (ROUTINE TESTING W REFLEX): HIV: NONREACTIVE

## 2011-09-29 LAB — GC/CHLAMYDIA PROBE AMP, GENITAL: GC Probe Amp, Genital: NEGATIVE

## 2011-09-30 LAB — CULTURE, OB URINE: Colony Count: NO GROWTH

## 2011-10-02 ENCOUNTER — Other Ambulatory Visit: Payer: Self-pay | Admitting: Family Medicine

## 2011-10-02 ENCOUNTER — Ambulatory Visit (HOSPITAL_COMMUNITY)
Admission: RE | Admit: 2011-10-02 | Discharge: 2011-10-02 | Disposition: A | Discharge status: Home / Self Care | Payer: Self-pay | Source: Ambulatory Visit | Attending: Family Medicine | Admitting: Family Medicine

## 2011-10-02 DIAGNOSIS — O34219 Maternal care for unspecified type scar from previous cesarean delivery: Secondary | ICD-10-CM | POA: Insufficient documentation

## 2011-10-02 DIAGNOSIS — Z331 Pregnant state, incidental: Secondary | ICD-10-CM

## 2011-10-02 DIAGNOSIS — Z3689 Encounter for other specified antenatal screening: Secondary | ICD-10-CM | POA: Insufficient documentation

## 2011-10-05 ENCOUNTER — Ambulatory Visit (INDEPENDENT_AMBULATORY_CARE_PROVIDER_SITE_OTHER): Payer: Self-pay | Admitting: Family Medicine

## 2011-10-05 ENCOUNTER — Encounter: Payer: Self-pay | Admitting: Family Medicine

## 2011-10-05 VITALS — BP 105/71 | Wt 109.2 lb

## 2011-10-05 DIAGNOSIS — Z3490 Encounter for supervision of normal pregnancy, unspecified, unspecified trimester: Secondary | ICD-10-CM

## 2011-10-05 DIAGNOSIS — Z603 Acculturation difficulty: Secondary | ICD-10-CM

## 2011-10-05 DIAGNOSIS — Z23 Encounter for immunization: Secondary | ICD-10-CM

## 2011-10-05 DIAGNOSIS — K089 Disorder of teeth and supporting structures, unspecified: Secondary | ICD-10-CM

## 2011-10-05 DIAGNOSIS — Z3480 Encounter for supervision of other normal pregnancy, unspecified trimester: Secondary | ICD-10-CM

## 2011-10-05 DIAGNOSIS — Z609 Problem related to social environment, unspecified: Secondary | ICD-10-CM

## 2011-10-05 HISTORY — DX: Encounter for supervision of normal pregnancy, unspecified, unspecified trimester: Z34.90

## 2011-10-05 NOTE — Patient Instructions (Signed)
Queremos verte en cuatro semanas. Vamos a programarte su ultrasonido y Journalist, newspaper.

## 2011-10-05 NOTE — Progress Notes (Signed)
   Subjective:    Laurie Barnett is a G3P2 Unknown being seen today for her first obstetrical visit.  Her obstetrical history is significant for unremarkable history. Patient does intend to breast feed. Pregnancy history fully reviewed.  Patient reports backache, no contractions, no cramping, no leaking and occasional clear discharge.  Filed Vitals:   10/05/11 1525  BP: 105/71  Weight: 109 lb 3.2 oz (49.533 kg)    HISTORY: OB History    Grav Para Term Preterm Abortions TAB SAB Ect Mult Living   3 2        2      # Outc Date GA Lbr Len/2nd Wgt Sex Del Anes PTL Lv   1 PAR 2003     CCS      2 PAR 2011 [redacted]w[redacted]d    [facility]      Comments: cephalopelvic disproportion noted.    3 CUR              No past medical history on file. No past surgical history on file. No family history on file.   Exam    Uterine Size: 14 cm  Pelvic Exam:    Perineum: deferred   Vulva: deferred   Vagina:  Deferred       Cervix: deferred   Adnexa: deferred   Bony Pelvis: reported previous cephalopevlic disproportion. DId not examine.   System: Breast:  deferred   Skin: normal coloration and turgor, no rashes    Neurologic: oriented, normal, no focal deficits   Extremities: normal strength, tone, and muscle mass, no musculoskeletal defects noted   HEENT PERRLA, extra ocular movement intact, sclera clear, anicteric, thyroid without masses, trachea midline and poor dentition with 1 removed molar   Mouth/Teeth mucous membranes moist, pharynx normal without lesions   Neck supple and no masses   Cardiovascular: regular rate and rhythm, no murmurs or gallops   Respiratory:  appears well, vitals normal, no respiratory distress, acyanotic, normal RR, chest clear, no wheezing, crepitations, rhonchi, normal symmetric air entry   Abdomen: soft, non-tender; bowel sounds normal; no masses,  no organomegaly   Urinary: deferred      Assessment:    Pregnancy: G3P2 Patient Active Problem List  Diagnoses    . DENTAL PAIN  . Supervision of normal pregnancy        Plan:     Initial labs drawn at previous visit.  Prenatal vitamins patient already taking. She is currently breastfeeding her youngest child.  Problem list reviewed and updated. PHQ9 score of 4, with 2 for trouble falling or staying asleep, 1 for little interest in doing things and feeling tired.  Flu shot and TDAP today.  Had pelvic exam week before and patient requested not to have repeat.   Genetic Screening discussed Quad Screen: declined.  Ultrasound discussed; fetal survey: reviewed results, will schedule for anatomy scan 18-21 weeks. .  Follow up in 4 weeks. Will need to set up with OB clinic.   Patient elects for elective repeat c section so will need to set up with OB from 30-32 weeks.  50% of 60 min visit spent on counseling and coordination of care.     Tana Conch 10/05/2011

## 2011-10-08 ENCOUNTER — Encounter: Payer: Self-pay | Admitting: Family Medicine

## 2011-10-08 DIAGNOSIS — Z603 Acculturation difficulty: Secondary | ICD-10-CM | POA: Insufficient documentation

## 2011-10-08 HISTORY — DX: Acculturation difficulty: Z60.3

## 2011-10-08 NOTE — Progress Notes (Signed)
Note reviewed.  Will get a bit more information next visit about genetic and infection screening questions and previous OB hx.   Agree with plan for repeat section.

## 2011-10-18 ENCOUNTER — Ambulatory Visit (INDEPENDENT_AMBULATORY_CARE_PROVIDER_SITE_OTHER): Payer: Self-pay | Admitting: Family Medicine

## 2011-10-18 VITALS — BP 95/71 | Temp 97.7°F | Wt 110.9 lb

## 2011-10-18 DIAGNOSIS — Z3490 Encounter for supervision of normal pregnancy, unspecified, unspecified trimester: Secondary | ICD-10-CM

## 2011-10-18 DIAGNOSIS — Z3480 Encounter for supervision of other normal pregnancy, unspecified trimester: Secondary | ICD-10-CM

## 2011-10-18 NOTE — Progress Notes (Signed)
30 yo G3P2 here for follow up.  Doing well.  Sometimes feels a little nausea and weakness if she stands suddenly.  Otherwise, no c/o. A/P: Pregnancy- going well.  Anatomy scan already scheduled for 10/22/11.  F/u 4 weeks with Dr. Durene Cal. Varicella unknown - check titers today. H/o section x 2 - will need repeat section.  Schedule appt with OB to discuss around 30-32 weeks.

## 2011-10-18 NOTE — Patient Instructions (Signed)
It was nice to see you. If you have bad pain or vaginal bleeding, please call us or go to [facility]. Please come back in 4 weeks to see Dr. Durene Cal. Let us know if you have any concerns. Fue agradable verte. Si usted tiene Education officer, museum o sangrado vaginal, por favor llmenos o vaya al [employer]. Por favor, vuelva en 4 semanas para ver al Dr. Durene Cal. Hganos saber si usted tiene alguna preocupacin.

## 2011-10-19 LAB — VARICELLA ZOSTER ANTIBODY, IGG: Varicella IgG: 0.21 {ISR}

## 2011-10-22 ENCOUNTER — Encounter: Payer: Self-pay | Admitting: Family Medicine

## 2011-10-22 ENCOUNTER — Ambulatory Visit (HOSPITAL_COMMUNITY)
Admission: RE | Admit: 2011-10-22 | Discharge: 2011-10-22 | Disposition: A | Discharge status: Home / Self Care | Payer: Self-pay | Source: Ambulatory Visit | Attending: Family Medicine | Admitting: Family Medicine

## 2011-10-22 DIAGNOSIS — O358XX0 Maternal care for other (suspected) fetal abnormality and damage, not applicable or unspecified: Secondary | ICD-10-CM | POA: Insufficient documentation

## 2011-10-22 DIAGNOSIS — O34219 Maternal care for unspecified type scar from previous cesarean delivery: Secondary | ICD-10-CM | POA: Insufficient documentation

## 2011-10-22 DIAGNOSIS — Z2839 Other underimmunization status: Secondary | ICD-10-CM

## 2011-10-22 DIAGNOSIS — Z3490 Encounter for supervision of normal pregnancy, unspecified, unspecified trimester: Secondary | ICD-10-CM

## 2011-10-22 DIAGNOSIS — O09899 Supervision of other high risk pregnancies, unspecified trimester: Secondary | ICD-10-CM | POA: Insufficient documentation

## 2011-10-22 DIAGNOSIS — Z363 Encounter for antenatal screening for malformations: Secondary | ICD-10-CM | POA: Insufficient documentation

## 2011-10-22 DIAGNOSIS — Z1389 Encounter for screening for other disorder: Secondary | ICD-10-CM | POA: Insufficient documentation

## 2011-10-22 DIAGNOSIS — Z283 Underimmunization status: Secondary | ICD-10-CM

## 2011-10-22 HISTORY — DX: Other underimmunization status: Z28.39

## 2011-10-22 HISTORY — DX: Supervision of other high risk pregnancies, unspecified trimester: O09.899

## 2011-10-22 IMAGING — US US OB FOLLOW-UP
2 series · 14 of 28 positions shown · non-contrast
Comparison: none

OBSTETRICAL ULTRASOUND:
 This ultrasound exam was performed in the [HOSPITAL] Ultrasound Department.  The OB US report was generated in the AS system, and faxed to the ordering physician.  This report is also available in [HOSPITAL]?s Access[employer]ware and in [REDACTED] PACS.

[Series 1: us ob follow up · 0.20mm/px · 1 of 4 slices shown (1 of 2)]
[im 2/4]
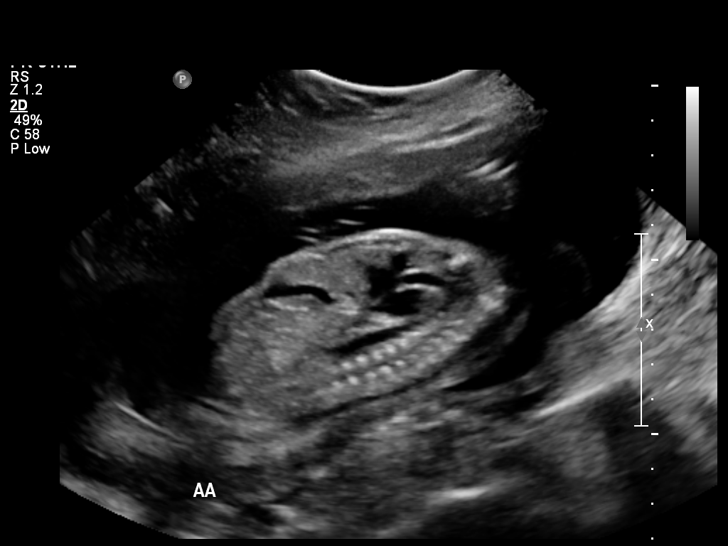

[Series 1: us ob follow up · 0.27mm/px · 13 of 32 slices shown (2 of 2)]
[im 1/32]
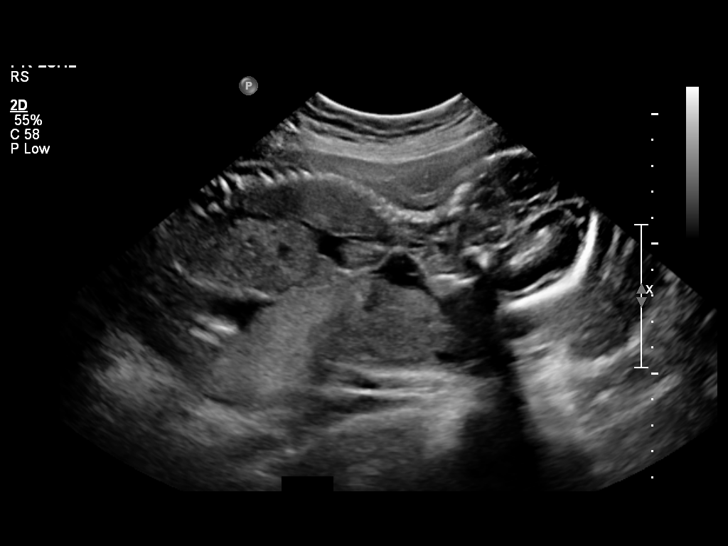
[im 3/32]
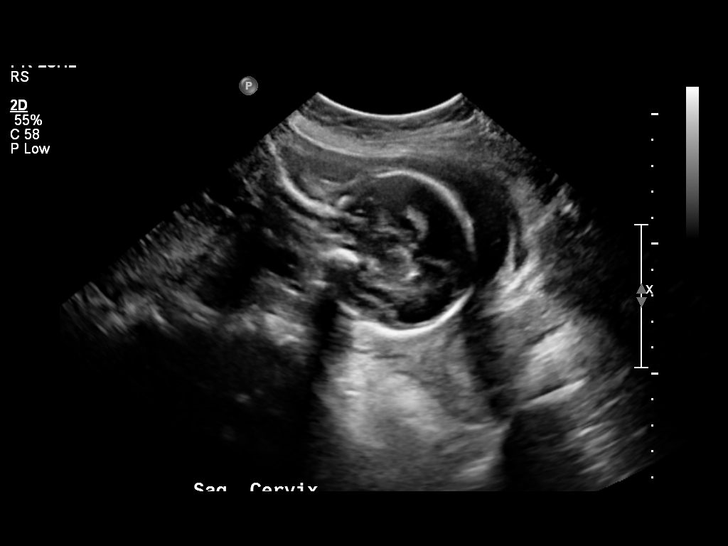
[im 6/32]
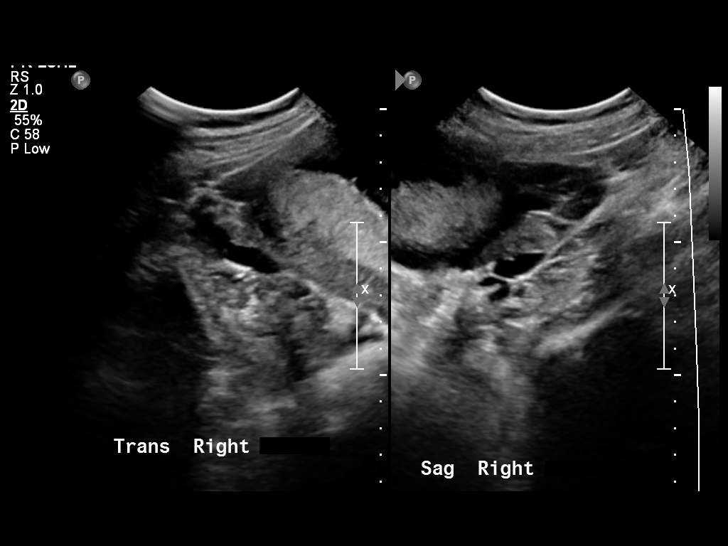
[im 8/32]
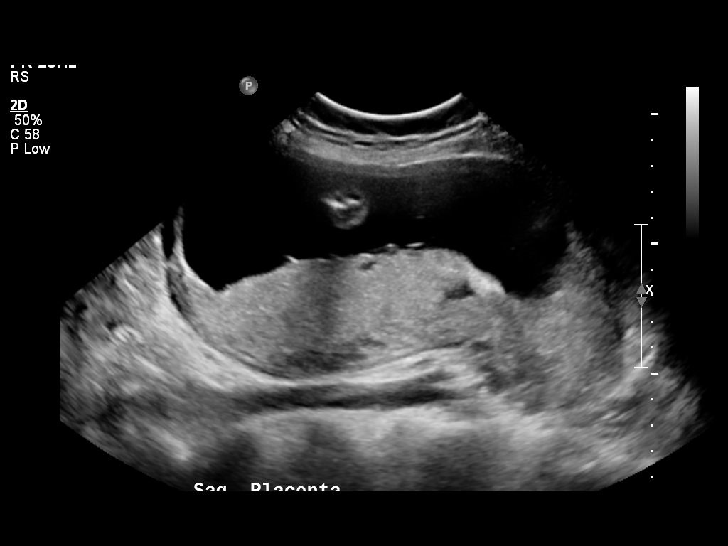
[im 11/32]
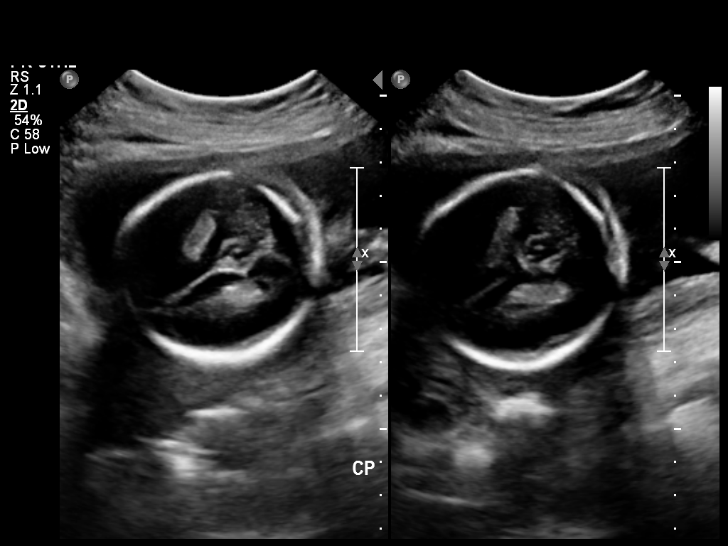
[im 13/32]
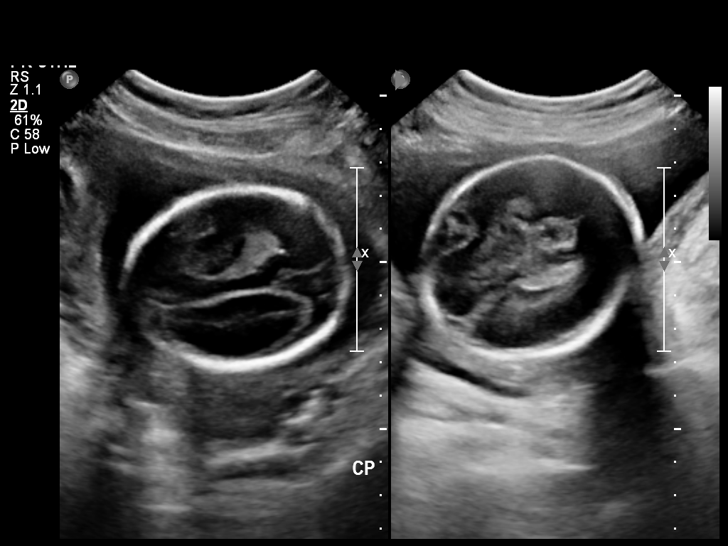
[im 16/32]
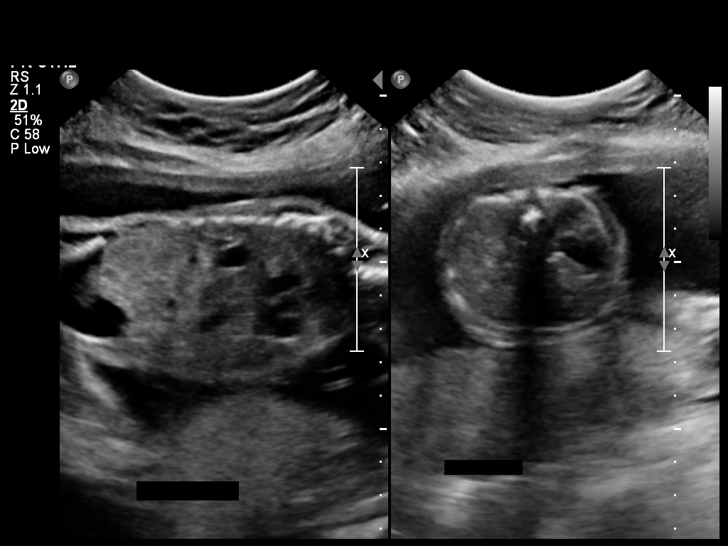
[im 19/32]
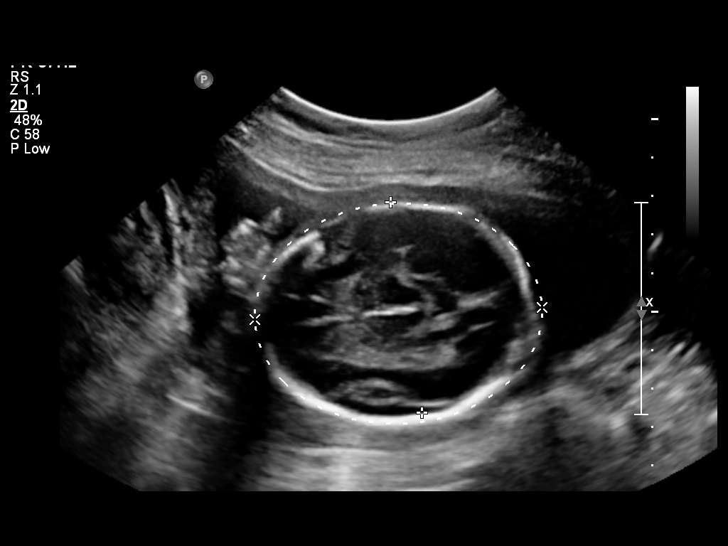
[im 21/32]
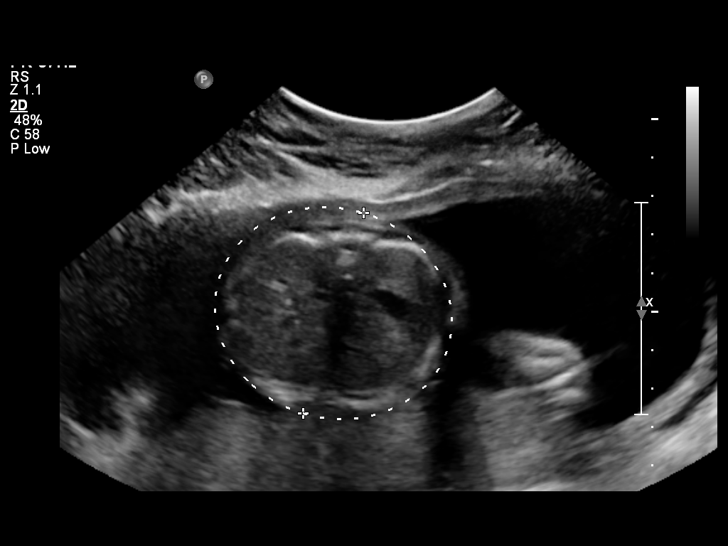
[im 24/32]
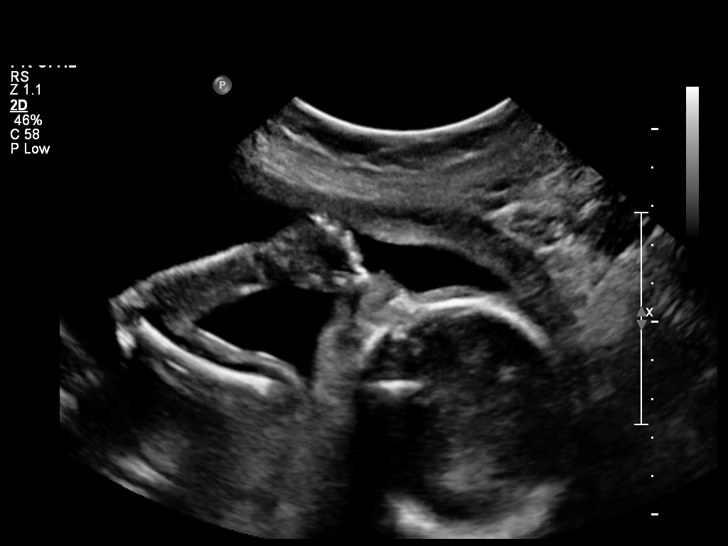
[im 26/32]
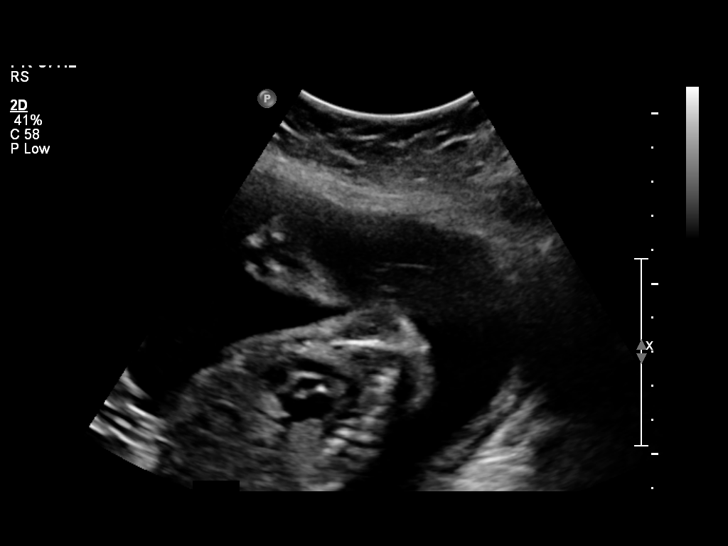
[im 29/32]
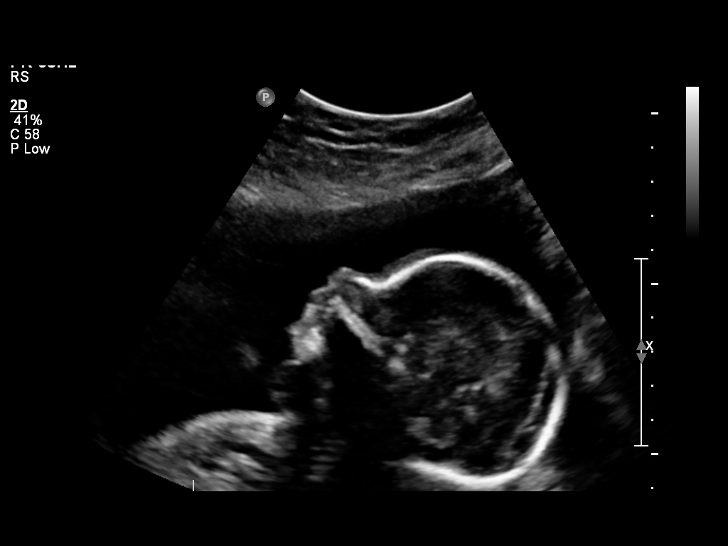
[im 32/32]
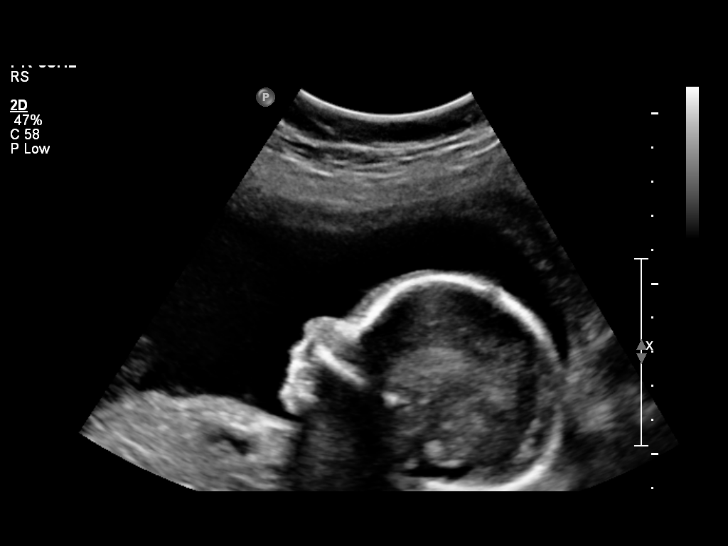

[14 of 28 positions shown; findings below may reference images not displayed]

IMPRESSION: See AS Obstetric US report.

## 2011-11-14 ENCOUNTER — Encounter: Payer: Self-pay | Admitting: Family Medicine

## 2011-11-14 ENCOUNTER — Ambulatory Visit (INDEPENDENT_AMBULATORY_CARE_PROVIDER_SITE_OTHER): Payer: Self-pay | Admitting: Family Medicine

## 2011-11-14 VITALS — BP 107/68 | Temp 97.6°F | Wt 116.1 lb

## 2011-11-14 DIAGNOSIS — Z3490 Encounter for supervision of normal pregnancy, unspecified, unspecified trimester: Secondary | ICD-10-CM

## 2011-11-14 DIAGNOSIS — Z3480 Encounter for supervision of other normal pregnancy, unspecified trimester: Secondary | ICD-10-CM

## 2011-11-14 NOTE — Patient Instructions (Addendum)
It was great to see you again today!  Your baby boy is growing well and his heart rate sounds excellent.   I would like for you to come back in about 4-5 weeks for your next appointment. We will do the sugary drink test at that appointment.   Let us know if you need anything before then, Dr. Durene Cal  !Era muy bein de ver te hoy otra ves!  Su bebe esta cresiendo bein y su latido de corazon se suena excelente.  Me gustaria que regresas in 4-5 semanas para su siquente cita. Vamos hacer el jugo dulce en esa cita.  Dejanos saber se necesitas also antes the esto.  Dr. Durene Cal  Mtodo para contar los movimientos fetales (Fetal Movement Counts) Nombre de la paciente: __________________________________________________ Franco Nones probable de parto:____________________ En los embarazos de alto riesgo se recomienda contar las pataditas, pero tambin es una buena idea que lo hagan todas las [place]. Comience a contarlas a las 28 semanas de embarazo. Los movimientos fetales aumentan luego de una comida Immunologist o de comer o beber algo dulce (el nivel de azcar en la sangre est ms alto). Tambin es importante beber gran cantidad de lquidos (hidratarse bien) antes de contar. Si se recuesta sobre el lado izquierdo mejorar la Designer, industrial/product, o puede sentarse en una silla cmoda con los brazos sobre el abdomen y sin distracciones que la rodeen. CONTANDO  Trate de contar a la [employer] lo haga.     Marque el da y la hora y vea cunto le lleva sentir 10 movimientos (patadas, agitaciones, sacudones, vueltas). Debe sentir al menos 10 movimientos en 2 horas. Probablemente sienta los 10 movimientos en menos de dos horas. Si no los siente, espere una hora y cuente nuevamente. Luego de [employer] tendr un patrn.     Debemos observar si hay cambios en el patrn o no hay suficientes pataditas en 2 horas. Le lleva ms tiempo contar los 10 movimientos?  SOLICITE ATENCIN MDICA SI:  Siente  menos de 10 pataditas en 2 horas. Intntelo dos veces.     No siente movimientos durante 1 hora.     El patrn se modifica o le lleva ms tiempo Art gallery manager las 10 pataditas.     Siente que el beb no se mueve como lo hace habitualmente.  Fecha: ____________ Movimientos: ____________ Comienzo hora: ____________ [name]: ____________ Franco Nones: ____________ Movimientos: ____________ Comienzo hora: ____________ [name]: ____________ Franco Nones: ____________ Movimientos: ____________ Comienzo hora: ____________ [name]: ____________ Franco Nones: ____________ Movimientos: ____________ Comienzo hora: ____________ [name]: ____________ Franco Nones: ____________ Movimientos: ____________ Comienzo hora: ____________ [name]: ____________ Franco Nones: ____________ Movimientos: ____________ Comienzo hora: ____________ [name]: ____________ Franco Nones: ____________ Movimientos: ____________ Comienzo hora: ____________ [name]: ____________   Franco Nones: ____________ Movimientos: ____________ Comienzo hora: ____________ [name]: ____________ Franco Nones: ____________ Movimientos: ____________ Comienzo hora: ____________ [name]: ____________ Franco Nones: ____________ Movimientos: ____________ Comienzo hora: ____________ [name]: ____________ Franco Nones: ____________ Movimientos: ____________ Comienzo hora: ____________ [name]: ____________ Franco Nones: ____________ Movimientos: ____________ Comienzo hora: ____________ [name]: ____________ Franco Nones: ____________ Movimientos: ____________ Comienzo hora: ____________ [name]: ____________ Franco Nones: ____________ Movimientos: ____________ Comienzo hora: ____________ [name]: ____________   Franco Nones: ____________ Movimientos: ____________ Comienzo hora: ____________ [name]: ____________ Franco Nones: ____________ Movimientos: ____________ Comienzo hora: ____________ [name]: ____________ Franco Nones: ____________ Movimientos: ____________ Comienzo hora: ____________ [name]: ____________ Franco Nones: ____________  Movimientos: ____________ Comienzo hora: ____________ [name]: ____________ Franco Nones: ____________ Movimientos: ____________ Comienzo hora: ____________ [name]: ____________ Franco Nones: ____________ Movimientos: ____________  Comienzo hora: ____________ [name]: ____________ Franco Nones: ____________ Movimientos: ____________ Comienzo hora: ____________ [name]: ____________   Franco Nones: ____________ Movimientos: ____________ Comienzo hora: ____________ [name]: ____________ Franco Nones: ____________ Movimientos: ____________ Comienzo hora: ____________ [name]: ____________ Franco Nones: ____________ Movimientos: ____________ Comienzo hora: ____________ [name]: ____________ Franco Nones: ____________ Movimientos: ____________ Comienzo hora: ____________ [name]: ____________ Franco Nones: ____________ Movimientos: ____________ Comienzo hora: ____________ [name]: ____________ Franco Nones: ____________ Movimientos: ____________ Comienzo hora: ____________ [name]: ____________ Franco Nones: ____________ Movimientos: ____________ Comienzo hora: ____________ [name]: ____________   Franco Nones: ____________ Movimientos: ____________ Comienzo hora: ____________ [name]: ____________ Franco Nones: ____________ Movimientos: ____________ Comienzo hora: ____________ [name]: ____________ Franco Nones: ____________ Movimientos: ____________ Comienzo hora: ____________ [name]: ____________ Franco Nones: ____________ Movimientos: ____________ Comienzo hora: ____________ [name]: ____________ Franco Nones: ____________ Movimientos: ____________ Comienzo hora: ____________ [name]: ____________ Franco Nones: ____________ Movimientos: ____________ Comienzo hora: ____________ [name]: ____________ Franco Nones: ____________ Movimientos: ____________ Comienzo hora: ____________ [name]: ____________   Franco Nones: ____________ Movimientos: ____________ Comienzo hora: ____________ [name]: ____________ Franco Nones: ____________ Movimientos: ____________ Comienzo hora: ____________ [name]:  ____________ Franco Nones: ____________ Movimientos: ____________ Comienzo hora: ____________ [name]: ____________ Franco Nones: ____________ Movimientos: ____________ Comienzo hora: ____________ [name]: ____________ Franco Nones: ____________ Movimientos: ____________ Comienzo hora: ____________ [name]: ____________ Franco Nones: ____________ Movimientos: ____________ Comienzo hora: ____________ [name]: ____________ Franco Nones: ____________ Movimientos: ____________ Comienzo hora: ____________ [name]: ____________   Franco Nones: ____________ Movimientos: ____________ Comienzo hora: ____________ [name]: ____________ Franco Nones: ____________ Movimientos: ____________ Comienzo hora: ____________ [name]: ____________ Franco Nones: ____________ Movimientos: ____________ Comienzo hora: ____________ [name]: ____________ Franco Nones: ____________ Movimientos: ____________ Comienzo hora: ____________ [name]: ____________ Franco Nones: ____________ Movimientos: ____________ Comienzo hora: ____________ [name]: ____________ Franco Nones: ____________ Movimientos: ____________ Comienzo hora: ____________ [name]: ____________ Franco Nones: ____________ Movimientos: ____________ Comienzo hora: ____________ [name]: ____________   Franco Nones: ____________ Movimientos: ____________ Comienzo hora: ____________ [name]: ____________ Franco Nones: ____________ Movimientos: ____________ Comienzo hora: ____________ [name]: ____________ Franco Nones: ____________ Movimientos: ____________ Comienzo hora: ____________ [name]: ____________ Franco Nones: ____________ Movimientos: ____________ Comienzo hora: ____________ [name]: ____________ Franco Nones: ____________ Movimientos: ____________ Comienzo hora: ____________ [name]: ____________ Franco Nones: ____________ Movimientos: ____________ Comienzo hora: ____________ [name]: ____________ Franco Nones: ____________ Movimientos: ____________ Comienzo hora: ____________ Fin hora: ____________   Document Released: 01/15/2008 Document Revised:  06/20/2011 ExitCare Patient Information 2012 [place], [place].Nacimiento prematuro      (Preterm Birth) Un nacimiento prematuro ocurre antes de las 37 semanas de embarazo La mayora de los embarazos dura entre 39 y 41 semanas. Cada semana de permanencia dentro del tero es importante y beneficioso para la salud del beb. Los bebs que nacen antes de la semana 37 de gestacin tienen ms riesgo de sufrir complicaciones Segn cuando nazca, el beb puede ser:    Prematuro tardo. Nacer entre las semanas 32 y 37 de gestacin.     Muy prematuro Nacer antes de la semana 32 de gestacin.     Extremadamente prematuro Nacer antes de la semana 25 de gestacin.  Cuanto antes nazca el beb, mayor es la probabilidad de que sufra problemas relacionados con la prematuridad. Las complicaciones y los problemas que pueden verse en bebs que nacen prematuros son:    Problemas respiratorios (sndrome de distrs respiratorio).     Bajo peso del nio al nacer.     Dificultad para alimentarse.     Problemas para dormir     Designer, jewellery (ictericia)     Infecciones como la neumona.   Los bebs muy prematuros o extremadamente prematuros tienen riesgo de sufrir problemas mdicos ms  graves. Entre ellos se incluyen:    Problemas respiratorios.     Dificultades de la visin.     Problemas del desarrollo cerebral (hemorragia intraventricular).     Trastornos de Financial controller y Investment banker, corporate.     Retraso del desarrollo y Scientist, forensic.     Parlisis cerebral.     Problemas de alimentacin o intestinales graves (enterocolitis necrotizante).  CAUSAS   Hay 2 categoras de partos prematuros.    Parto prematuro espontneo El nacimiento resulta de un trabajo de parto prematuro (no inducido por el mdico) o por ruptura prematura de [place].     Parto prematuro indicado El nacimiento se produce por un trabajo de parto inducido por el mdico debido a razones de salud, Museum/gallery exhibitions officer o [place].  Puede estar  relacionado con algunos problemas mdicos, factores del estilo de vida o demogrficos que influyen en la madre o el feto.    Los problemas mdicos pueden ser:     Karolee Ohs mltiples (mellizos, trillizos y ms).     Infecciones.    Diabetes.    Enfermedades cardacas     Enfermedades renales.     Anormalidades del cuello o del tero.     Tener bajo peso     Hipertensin arterial o preeclampsia     Ruptura prematura de las [place].     Defectos congnitos en el feto.     Los factores de riesgo son:     Cuidados prenatales deficientes.     Malnutricin o anemia.     Consumo de cigarrillos     Consumo de alcohol     Altos niveles de estrs o falta de apoyo social o emocional.     exposicin a sustancias qumicas o toxinas ambientales     Consumo de drogas     Los factores demogrficos pueden ser:     Engineer, agricultural.     La edad (madre menor de 18 aos o mayor de 35 aos).     Nivel socioeconmico bajo.  Las mujeres que tienen una historia de parto prematuro o que quedan embarazadas dentro de los 18 meses de haber dado a Patent examiner, tambin tienen riesgo de parto prematuro.   DIAGNSTICO   El mdico solicitar pruebas adicionales para diagnosticar las complicaciones subyacentes que puedan resultar del parto prematuro. Las pruebas para el beb son:    Examen fsico     Anlisis de Retail buyer.     Radiografa de trax     Monitoreo de los pulmones y Insurance underwriter  PREVENCIN   Hay algunas cosas que usted puede hacer para disminuir rl riesgo de tener un beb prematuro en el futuro. Samson Frederic son:    Dione Housekeeper atencin prenatal durante todo Firefighter. Consultar regularmente al mdico para recibir consejos y Paramedic.     Controlar las enfermedades subyacentes.     Cambios en el estilo de vida y cuidados adecuados.     Dieta [place] y control del peso.     Observe si tiene signos de infeccin.  TRATAMIENTO   Luego del nacimiento, debe tener especial cuidado  para evaluar cualquier problema o complicaciones del beb. Deben proporcionarse cuidados adicionales al beb. El tratamiento depende de los problemas que tenga y de las complicaciones que surjan. Algunos bebs prematuros deben ser atendidos en una terapia intensiva neonatal. En general, los cuidados incluyen:    Mantener la temperatura y el oxgeno en una caja transparente calefaccionada (incubadora     Controlar la frecuencia cardaca, la respiracin y [place]  de oxgeno en la sangre.del beb.     Verificar si hay signos de infeccin y si es Biochemist, clinical antibiticos por va intravenosa.     Insertar un tubo de alimentacin (boca, nariz) o Building services engineer nutricin por va IV, si no puede alimentarse.     Insertar un tubo de respiracin (ventilacin).     Asistencia respiratoria (presin continua positiva en las vas areas u oxgeno).   El tratamiento cambiar a medida que el beb se fortalezca y pueda respirar y comer por sus propios medios. En algunos bebs no es Administrator, Civil Service. Los padres deben ser advertidos de los riesgos potenciales para la salud en un beb prematuro.   INSTRUCCIONES PARA EL CUIDADO EN EL HOGAR    Comprender las condiciones y necesidades especiales de su beb. Puede ser tranquilizador aprender las tcnicas de resucitacin cardiopulmonar para los bebs.     Controle al [employer] en el asiento del automvil hasta que se desarrolle y [place]. Los asientos infantiles pueden causar dificultades respiratorias en los bebs prematuros.     Mantenga al nio calefaccionado. Vstalo en capas y [place] de corrientes de aire, especialmente en los meses de fro.     Lvese bien las manos luego de ir al bao o al cambiar paales. Los bebs prematuros tardos pueden ser ms propensos a las infecciones.     Siga todas las indicaciones del pediatra para proporcionarle asistencia y cuidados al beb prematuro.     Pida ayuda a las organizaciones y  grupos que comprenden estos desafos.     Concurra a las visitas de control con el mdico, segn le haya indicado.  SOLICITE ATENCIN MDICA SI:    El beb tiene dificultad para alimentarse.     El beb tiene dificultad para dormir.     El beb tiene dificultad para respirar.     La piel del nio se vuelve amarilla (ictericia).     El beb [place] signos de infeccin como secreciones nasales, fiebre, llanto o coloracin azulada de la piel.  Irven Shelling MS INFORMACIN   March of Dimes: www.marchofdimes.com   Prematurity.org: www.prematurity.org   Document Released: 03/26/2008 Document Revised: 06/20/2011 [facility] Patient Information 2012 [place], [place].

## 2011-11-14 NOTE — Progress Notes (Signed)
Laurie Barnett 31 y.o. female  G3P2 at [redacted]w[redacted]d presenting for follow up. Patient is doing well and has no complaints. Not currently having nausea/weakness that previously experienced.  O: Anatomy scan reviewed and showed posterior placenta. Breech presentation and EFW 49% at 18.3. Female.  A/P: Pregnancy progessing well. Varicella nonimmune and will require vaccination after birht.  Plan to breastfeed. Unsure about circumcision. Possible vasectomy for husband.  Reviewed kick counts and preterm labor (handouts given) as patient wont be seen again until 26 weeks.  Pregnancy medical home form completed.

## 2011-12-17 ENCOUNTER — Encounter: Payer: Self-pay | Admitting: Family Medicine

## 2011-12-17 ENCOUNTER — Ambulatory Visit (INDEPENDENT_AMBULATORY_CARE_PROVIDER_SITE_OTHER): Payer: Self-pay | Admitting: Family Medicine

## 2011-12-17 VITALS — BP 97/63 | Wt 120.0 lb

## 2011-12-17 DIAGNOSIS — Z3490 Encounter for supervision of normal pregnancy, unspecified, unspecified trimester: Secondary | ICD-10-CM

## 2011-12-17 DIAGNOSIS — Z3480 Encounter for supervision of other normal pregnancy, unspecified trimester: Secondary | ICD-10-CM

## 2011-12-17 LAB — CBC
HCT: 36.3 % (ref 36.0–46.0)
MCH: 28.7 pg (ref 26.0–34.0)
MCHC: 32 g/dL (ref 30.0–36.0)
MCV: 89.9 fL (ref 78.0–100.0)
RDW: 13.8 % (ref 11.5–15.5)

## 2011-12-17 NOTE — Progress Notes (Signed)
Laurie Barnett 31 y.o. female G3P2 at [phone] presenting for follow up. Patient is doing well except for a "pain under her ribs". She says it is a 5/10 burning at baseline and goes up to 10/10 sharp pain with certain movements. Says it is the exact same pain she has had in previous pregnancies just more intense. Resting again improves pain. She tried a belly band which did not help. She has not tried any medications. Willing to try tylenol. Patient still familiar with kick counts and signs of preterm labor.  O: see vitals/notes section. PHQ 9 score of 3.   A/P: Adopt a mom with Pregnancy progessing well. Varicella nonimmune and will require vaccination after birth.  Reviewed kick counts and preterm labor again.  Patient can use tylenol for "rib pain".  Pregnancy medical home form completed at 22 weeks. Will need repeat form at 30 weeks.  OB clinic at 30 weeks then every 2 weeks.  28 week labs-HIV/RPR/CBC today. 1 hour glucola completed and not elevated.  Will send letter to ob/gyn to have patient seen between 30-32 weeks as will be a repeat c-section.   Mother plans:Plan to breastfeed. Does not want circumcision. Possible vasectomy for husband but still undecided for contraception. [facility] for pediatrician.

## 2011-12-17 NOTE — Patient Instructions (Signed)
Mrs.  Laurie Barnett,   Fue bien ver te hoy.    1. Todo se ve bien con tu embarazo. 2. Nosotros vamos a decir te si su examin del azucar sale abnormal.  3. Yo voy a programarte una cita para ver los doctores que te van hacer la cesaria.  4. Vamos a conseguir otros samples de su sangre hoy tambien.   Por favor regrese en 4 semanas para ver la clininca de OB. Por favor siente se libre de llamar antes si tienes preguntas.   Sinceramente,  Dr. Tana Conch

## 2011-12-18 NOTE — Progress Notes (Signed)
Addendum: placing referral to OB/GYN for repeat c-section. Will have Marines Harrold, interpreter call patient to schedule appointment with Dr. Shawnie Pons at [facility] on one of her clinic days.

## 2011-12-18 NOTE — Progress Notes (Signed)
Addended by: Shelva Majestic on: 12/18/2011 02:00 PM   Modules accepted: Orders

## 2011-12-31 ENCOUNTER — Ambulatory Visit (INDEPENDENT_AMBULATORY_CARE_PROVIDER_SITE_OTHER): Payer: Self-pay | Admitting: Family Medicine

## 2011-12-31 VITALS — BP 96/68 | HR 84 | Temp 97.7°F | Wt 121.7 lb

## 2011-12-31 DIAGNOSIS — Z3490 Encounter for supervision of normal pregnancy, unspecified, unspecified trimester: Secondary | ICD-10-CM

## 2011-12-31 DIAGNOSIS — O34219 Maternal care for unspecified type scar from previous cesarean delivery: Secondary | ICD-10-CM

## 2011-12-31 LAB — POCT URINALYSIS DIP (DEVICE)
Bilirubin Urine: NEGATIVE
Hgb urine dipstick: NEGATIVE
Specific Gravity, Urine: 1.02 (ref 1.005–1.030)
pH: 6 (ref 5.0–8.0)

## 2011-12-31 NOTE — Patient Instructions (Addendum)
Vasectoma (Vasectomy) La vasectoma es la oclusin (con o sin corte) del tubo que lleva la esperma desde el testculo (conducto deferente). La vasectoma impide que la esperma vaya hasta el conducto deferente y el pene, de modo que durante las relaciones sexuales, no llegue a la vagina. Es un procedimiento seguro, con muy pocas complicaciones. No afecta el deseo ni el desempeo sexual.  Como es un procedimiento de Lennar Corporation, no debe hacerlo hasta estar seguro de que no quiere tener hijos. Usted y su pareja deben estar en un todo de acuerdo antes del procedimiento. La decisin de [employer] Corporation vasectoma no debe tomarse durante una situacin estresante. Por ejemplo en caso de prdida de un embarazo, enfermedad, muerte de la esposa o divorcio. Existen otros mtodos anticonceptivos que pueden utilizarse hasta que est completamente seguro de que quiere realizarlo. Este procedimiento no lo proteger contra las infecciones transmitidas sexualmente. Los hombres que desean que la vasectoma sea revertida, necesitan someterse a una operacin. Puede que no se obtenga un buen resultado. Tiene menos riesgos y es menos costosa que Education officer, environmental una esterilizacin tubrica en la mujer.  INFORME AL PROFESIONAL SOBRE LOS SIGUIENTES PUNTOS:  Sufre [employer]power Inc toma, incluyendo hierbas, gotas oftlmicas, medicamentos de venta libre y cremas   Uso de esteroides (por va oral o cremas)   Problemas anteriores debido a anestsicos o a Patent examiner.   Antecedentes de haber tenido cogulos sanguneos (tromboflebitis)   Antecedentes de hemorragias o problemas sanguneos   Cirugas anteriores  RIESGOS Y COMPLICACIONES  Fracaso en el procedimiento para provocar infertilidad significa que la mujer puede quedar embarazada   Dolor en el postoperatorio Generalmente se trata con analgsicos.   Infeccin. Un germen comienza a desarrollarse en la herida. Generalmente se trata con antibiticos.   Una  reaccin alrgica a la anestesia o a otro Radiographer, therapeutic.   Hemorragias. La hemorragia puede aparecer debajo de la piel, de modo que el escroto y el pene parecen tener hematomas. En algunos casos el escroto puede hincharse y tener el tamao de un pomelo. Esto generalmente desaparece en [employer].  PROCEDIMIENTO  El escroto se limpia con jabn antibacterial y el profesional busca el conducto deferente.   Se anestesia cada lado del escroto.   Se realiza un corte muy pequeo(incisin) y el conducto deferente se saca afuera del escroto. El conducto deferente se ata, se corta o se quema (cauterizacin) en los extremos.   En algunos casos se retira del escroto a travs de una puncin. Esto se realiza con un instrumento especial, sin incisin.   Luego el conducto deferente se coloca nuevamente en el escroto y la incisin o puncin se cierran.   Luego de la [place], podr haber esperma en el conducto deferente por 1 a 3 meses. Por ello, debe utilizarse otro mtodo anticonceptivo hasta que el mdico lo examine y encuentre que no hay esperma en el lquido seminal.   La castracin es otro mtodo que consiste en la remocin de ambos testculos. Esto produce la esterilidad en el hombre.  La realizacin de una vasectoma debe discutirse con el mdico, estando su pareja presente. Formule preguntas y hable acerca de sus preocupaciones con el profesional que lo asiste. Luego, puede decidir si la operacin es segura para usted. Puede cambiar de opinin y cancelar la ciruga en cualquier momento.  INSTRUCCIONES PARA EL CUIDADO DOMICILIARIO  Utilice los medicamentos de venta libre o de prescripcin para Chief Technology Officer, el malestar o la [place],  segn se lo indique el profesional que lo asiste.   Una bolsa de hielo colocada durante 15 a 20 minutos, 3 a 4 veces por da disminuir la hinchazn.   Utilice un suspensor deportivo para disminuir la hinchazn.   Evite las [employer] primeros [address]  [address] posteriores a la [place].   Mantenga las incisiones limpias y cubiertas para evitar la infeccin.   Dur[employer] Financial risk analyst o segundo da despus de la [place], es normal cierta prdida de [place].   No practique deportes ni realice trabajos fsicos pesados durante al [employer].   Siga las instrucciones del mdico con respecto a la dieta, el reposo, el [place], las [place] y sexuales y cumpla con las visitas de control.  SOLICITE ATENCIN MDICA DE INMEDIATO SI:  Presenta enrojecimiento, hinchazn o aumento del dolor en la herida o en los testculos.   Aparece pus en la herida.   La temperatura oral se eleva sin motivo por encima de 102 F (38.9 C) o segn le indique el profesional que lo asiste.   Advierte un olor ftido que proviene de la herida o del vendaje.   La herida se abre o los bordes se separan, an luego de la remocin de las suturas. En algunos casos las incisiones no se suturan.   Observa una hemorragia que proviene de la herida.  EST SEGURO QUE:   Comprende las instrucciones para el alta mdica.   Controlar su enfermedad.   Solicitar atencin mdica de inmediato segn las indicaciones.  Document Released: 03/26/2008 Document Revised: 09/27/2011 [facility] Patient Information 2012 [place], [place]. Parto por cesrea  (Cesarean Delivery) El parto por cesrea es el nacimiento de un feto a travs de un corte (incisin) en el vientre (abdomen) y en el matriz (tero).  HGALE SABER A SU MDICO SOBRE:   Complicaciones del embarazo.   Alergias.   Medicamentos que [place], incluyendo hierbas, gotas oftlmicas, medicamentos de [place] y Control and instrumentation engineer.   Uso de corticoides (por va oral o cremas).   Problemas anteriores debido a anestsicos o a medicamentos que [employer] sensibilidad.   Cirugas anteriores.   Historia de cogulos sanguneos   Problemas hemorrgicos o en la sangre.   Otros problemas de [place].  RIESGOS Y  COMPLICACIONES  Hemorragias.   Infeccin.   Cogulos sanguneos.   Lesin en los rganos circundantes.   Problemas con la anestesia.   Lesiones al beb.  ANTES DEL PROCEDIMIENTO   Le insertarn un tubo (catter [employer] vejiga. El catter Foley drena la orina desde la vejiga hacia [place]. Esto mantendr la vejiga vaca durante la [place].   Le colocarn un catter de acceso intravenoso (IV) en el brazo.   Luego rasurarn la zona del pubis y la parte inferior del abdomen. Esto se realiza para evitar una infeccin en el sitio de la incisin.   Le administrarn un medicamento anticido. Esto impedir que los contenidos cidos del estmago ingresen a los pulmones si vomita durante el procedimiento.   Le podrn dar antibiticos para prevenir infecciones.  PROCEDIMIENTO   Le administrarn un medicamento para adormecer a zona inferior del cuerpo anestesia regional). Si estuviera en trabajo de parto, podrn aplicarle una anestesia epidural, que se utiliza tanto el el [place] de parto como en la cesrea. Puede ser [employer] administren un medicamento que la har dormir (anestesia general), aunque esto no es tan frecuente.   Le harn una incisin en el abdomen que se extiende Eli Lilly and Company. Hay dos tipos  bsicos de incisin:   La incisin horizontal (transversa) Las incisiones horizontales se realizan en la mayor parte de las cesreas de [place].   La incisin vertical (de Jolene Schimke). Esta es la menos frecuente. Se reserva para aquellas mujeres que tienen una complicacin grave (parto prematuro extremo) o bajo situaciones de Associate Professor.   Las incisiones horizontales y Garment/textile technologist pueden utilizarse ambas al [employer]. Sin embargo, no es Air traffic controller.   El beb nacer.  DESPUS DEL PROCEDIMIENTO   Si estuvo despierta durante la [place], podr ver al beb enseguida. Si la duermen, ver al beb tan pronto como despierte.   Podr amamantar a su beb despus del  procedimiento.   Podr levantarse y Therapist, art mismo da de la [place]. Si Office manager en cama durante cierto tiempo, recibir ayuda para darse vuelta, toser y Industrial/product designer profundamente despus de la ciruga. Esto ayuda a Environmental health practitioner en los pulmones, como la neumona.   No se levante de la cama sola la primera vez luego de la [place]. Necesitar ayuda para levantarse de la cama hasta que pueda hacerlo sola.   Podr darse Shaune Spittle el da siguiente a la [place]. Despus que le quiten el vendaje, un enfermero la ayudar a ducharse.   Tendr unas medias compresivas neumticas en los pies o en la zona inferior de las piernas. Se colocan para prevenir cogulos sanguneos. Cuando se levante y camine regularmente, ya no sern necesarias.   Nocruce las piernas al sentarse.   Si elimina cogulos de sangre, gurdelos. Si elimina un cogulo cuando va al bao, por favor no tire la cadena. Llame al enfermero. Comunquele al enfermero si piensa que tiene demasiada hemorragia o que elimina muchos cogulos.   Comience a beber lquidos y a alimentarse segn las indicaciones del mdico. Si el estmago no est en condiciones,comer y beber demasiado pronto puede causar aumento de la hinchazn y la inflamacin del intestino o el abdomen. Esto es [employer].   Le darn medicamentos si los necesita. Dgale a los profesionales si siente dolor. Ellos tratarn de que se sienta cmoda. Tambin le indicarn antibiticos para prevenir una infeccin.   Le quitarn la va intravenosa cuando beba una cantidad razonable de lquido. El catter Foley se retirar cuando se levante y camine.   Si su tipo sanguneo es Rh negativo y el beb es Rh positivo, le darn una inyeccin de inmunoblobulina anti D. Esta inyeccin evita que tenga problemas con el Rh en embarazos futuros. Deber colocarse la inyeccin an si se ha Production assistant, radio las trompas.   Si le permiten llevar al beb a dar un paseo,colquelo en la cunita y  empjela. No lleve al beb en sus brazos.  Document Released: 10/08/2005 Document Revised: 09/27/2011 [facility] Patient Information 2012 [place], [place]. Amamantar al beb (Breastfeeding) LOS BENEFICIOS DE AMAMANTAR Para el beb  La primera leche (calostro ) ayuda al mejor funcionamiento del sistema digestivo del beb.   La leche tiene anticuerpos que provienen de la madre y que ayudan a prevenir las infecciones en el beb.   Hay una menor incidencia de asma, enfermedades alrgicas y SMSI (sndrome de muerte sbita nfantil).   Los nutrientes que contiene la [place] materna son mejores que las frmulas para el bibern y favorecen el desarrollo cerebral.   Los bebs amamantados sufren menos gases, clicos y constipacin.  Para la mam  La lactancia materna favorece el desarrollo de un vnculo muy especial entre la madre y el beb.   Es ms conveniente, siempre  disponible a la temperatura adecuada y ms econmica que la [employer].   Consume caloras en la madre y la ayuda a perder el peso ganado durante el [place].   Favorece la contraccin del tero a su tamao normal, de manera ms rpida y [employer]away las hemorragias luego del [place].   Las M.[employer].C. Holdings que amamantan tienen menor riesgo de Geophysical data processor de mama.  AMAMNTELO CON FRECUENCIA  Un beb sano, nacido a trmino, puede amamantarse con tanta frecuencia como cada hora, o espaciar las comidas cada tres horas.   Esta frecuencia variar de un beb a otro. Observe al beb cuando manifieste signos de hambre, antes que regirse por el reloj.   Amamntelo tan seguido como el beb lo solicite, o cuando usted sienta la necesidad de Paramedic sus [place].   Despierte al beb si han pasado 3  4 horas desde la ltima comida.   El amamantamiento frecuente la ayudar a producir ms [place] y a Education officer, community de Engineer, mining en los pezones e hinchazn de las [place].  LA POSICIN DEL BEB PARA AMAMANTARLO  Ya sea que se encuentre acostada o  sentada, asegrese que el abdomen del beb enfrente el suyo.   Sostenga la mama con el pulgar por arriba y el resto de los dedos por debajo. Asegrese que sus dedos se encuentren lejos del pezn y de la boca del beb.   Toque suavemente los labios del beb y la mejilla ms cercana a la mama con el dedo o el pezn.   Cuando la boca del beb se abra lo suficiente, introduzca el pezn y la zona oscura que lo rodea tanto como le sea posible dentro de la boca.   Coloque a beb cerca suyo de modo que su nariz y mejillas toquen las mamas al [employer].  LAS COMIDAS  La duracin de cada comida vara de un beb a otro y de [place] comida a [place].   El beb debe succionar alrededor American Financial o tres minutos para que le llegue [place]. Esto se denomina "bajada". Por este motivo, permita que el nio se alimente en cada mama todo lo que desee. Terminar de mamar cuando haya recibido la cantidad [place] de nutrientes.   Para detener la succin coloque su dedo en la comisura de la boca del nio y Midwife entre sus encas antes de quitarle la mama de la boca. Esto la ayudar a English as a second language teacher.  REDUCIR LA CONGESTIN DE LAS MAMAS  Durante la primera semana despus del parto, usted puede experimentar Monsanto Company. Cuando las mamas estn congestionadas, se sienten calientes, llenas y molestas al tacto. Puede reducir la congestin si:   Lo amamanta frecuentemente, cada 2-3 horas. Las mams que [employer]W Corporation pronto y con frecuencia tienen menos problemas de [place].   Coloque bolsas fras livianas entre cada [place]. Esto ayuda a Building services engineer. Envuelva las bolsas de hielo en una toalla liviana para proteger su piel.   Aplique compresas hmedas calientes Wm[employer] W[employer]ley Jr. Company durante 5 a 10 minutos antes de amamantar al [employer]. Esto aumenta la circulacin y [place] a que la [place].   Masajee suavemente la mama antes y Psychologist, sport and exercise.   Asegrese que el nio vaca al menos una mama antes de  cambiar de lado.   Use un sacaleche para vaciar la mama si el beb se duerme o no se alimenta bien. Tambin podr Phelps [employer] con esta bomba si tiene que volver al trabajo o siente que las  mamas estn congestionadas.   Evite los biberones, chupetes o complementar la alimentacin con agua o jugos en lugar de la [place].   Verifique que el beb se encuentra en la posicin correcta mientras lo alimenta.   Evite el cansancio, el estrs y la anemia   Use un soutien que sostenga bien sus mamas y evite los que tienen aro.   Consuma una dieta balanceada y beba lquidos en cantidad.  Si sigue estas indicaciones, la congestin debe mejorar en 24 a 48 horas. Si an tiene dificultades, consulte a Barista. TENDR SUFICIENTE LECHE MI BEB? Algunas veces las madres se preocupan acerca de si sus bebs tendrn la leche suficiente. Puede asegurarse que el beb tiene la leche suficiente si:  El beb succiona y escucha que traga activamente.   El nio se alimenta al menos 8 a 12 veces en 24 horas. Alimntelo hasta que se desprenda por sus propios medios o se quede dormido en la primera mama (al menos durante 10 a 20 minutos), luego ofrzcale el otro lado.   El beb moja 5 a 6 paales descartables (6 a 8 paales de tela) en 24 horas cuando tiene 5  6 das de vida.   Tiene al menos 2-3 deposiciones todos los [employer]on, [employer] and Company primeros meses. La leche materna es todo el alimento que el beb necesita. No es necesario que el nio ingiera agua o preparados de bibern. De hecho, para ayudar a que sus mamas produzcan ms [place], lo mejor es no darle al beb suplementos durante las primeras semanas.   La materia fecal debe ser blanda y [place].   El beb debe aumentar 112 a 196 g por semana.  CUDESE Cuide sus mamas del siguiente modo:  Bese o dchese diariamente.   No lave sus pezones con jabn.   Comience a amamantar del lado izquierdo en una comida y del lado derecho en la  siguiente.   Notar que [employer]R Block suministro de [place] a los 2 a 5 [address]  [address] despus del [place]. Puede sentir algunas molestias por la congestin, lo que hace que sus mamas estn duras y sensibles. La congestin disminuye en 24 a 48 horas. Mientras tanto, aplique toallas hmedas calientes durante 5 a 10 minutos antes de amamantar. Un masaje suave y la extraccin de un poco de leche antes de Museum/gallery exhibitions officer ablandarn las mamas y har ms fcil que el beb se agarre. Use un buen sostn y seque al aire los pezones durante 10 a 15 minutos luego de cada alimentacin.   Solo utilice apsitos de algodn.   Utilice lanolina W[employer] International pezones luego de [place]. No necesita lavarlos luego de alimentar al [employer].  Cudese del siguiente modo:   Consuma alimentos bien balanceados y refrigerios nutritivos.   Dixie Dials, jugos de fruta y agua para Warehouse manager sed (alrededor de 8 vasos por Futures trader).   Descanse lo suficiente.   Aumente la ingesta de calcio en la dieta (1200mg /da).   Evite los alimentos que usted nota que puedan afectar al beb.  SOLICITE ATENCIN MDICA SI:  Tiene preguntas que formular o dificultades con la alimentacin a pecho.   Necesita ayuda.   Observa una zona dura, roja y que le duele en la zona de la mama, y se acompaa de fiebre de 100.5 F (38.1 C) o ms.   El beb est muy somnoliento como para alimentarse bien o tiene problemas para dormir.   El beb moja menos de 6 paales por da, a Glass blower/designer  de los 5 [address]   La piel del beb o la parte blanca de sus ojos est ms amarilla de lo que estaba en el hospital.   Se siente deprimida.  Document Released: 10/08/2005 Document Revised: 09/27/2011 [facility] Patient Information 2012 [place], [place].

## 2011-12-31 NOTE — Progress Notes (Signed)
For R-CS with h/o 2 prior--will schedule at 39 wks.  Husband will have vasectomy. Pt. With unsure LMP--U/s dating is used which is 03/21/2012

## 2011-12-31 NOTE — Progress Notes (Signed)
Pulse: 84

## 2012-01-10 ENCOUNTER — Ambulatory Visit (INDEPENDENT_AMBULATORY_CARE_PROVIDER_SITE_OTHER): Payer: Self-pay | Admitting: Family Medicine

## 2012-01-10 DIAGNOSIS — Z3480 Encounter for supervision of other normal pregnancy, unspecified trimester: Secondary | ICD-10-CM

## 2012-01-10 MED ORDER — CAPSAICIN-MENTHOL-METHYL SAL 0.025-1-12 % EX CREA: 1.0000 "application " | TOPICAL_CREAM | Freq: Three times a day (TID) | CUTANEOUS | Status: DC | PRN

## 2012-01-10 NOTE — Patient Instructions (Signed)
Fue un placer verle hoy; esta' con 22 semanas y 6 dias de Psychiatrist.  Para el dolor abdominal en el cuadrante izquierdo superior, mandamos una receta para una crema que se puede aplicar hasta tres veces por dia, si lo necesita.  Ademas, puede tomar Tylenol para el dolor cuando sehace insoportable.    Por favor haganos contacto si tiene contracciones regulares por una hora, si tiene desecho vaginal o sangrado, o con otros problemas/preguntas.  POR FAVOR HAGA UNA CITA CON EL DR HUNTER EN 2 SEMANAS/  OB APPOINTMENT WITH DR Durene Cal IN 2 WEEKS.  Mtodo para contar los movimientos fetales (Fetal Movement Counts) Nombre de la paciente: __________________________________________________ Laurie Barnett probable de parto:____________________ En los embarazos de alto riesgo se recomienda contar las pataditas, pero tambin es una buena idea que lo hagan todas las [place]. Comience a contarlas a las 28 semanas de embarazo. Los movimientos fetales aumentan luego de una comida Immunologist o de comer o beber algo dulce (el nivel de azcar en la sangre est ms alto). Tambin es importante beber gran cantidad de lquidos (hidratarse bien) antes de contar. Si se recuesta sobre el lado izquierdo mejorar la Designer, industrial/product, o puede sentarse en una silla cmoda con los brazos sobre el abdomen y sin distracciones que la rodeen. CONTANDO  Trate de contar a la [employer] lo haga.   Marque el da y la hora y vea cunto le lleva sentir 10 movimientos (patadas, agitaciones, sacudones, vueltas). Debe sentir al menos 10 movimientos en 2 horas. Probablemente sienta los 10 movimientos en menos de dos horas. Si no los siente, espere una hora y cuente nuevamente. Luego de [employer] tendr un patrn.   Debemos observar si hay cambios en el patrn o no hay suficientes pataditas en 2 horas. Le lleva ms tiempo contar los 10 movimientos?  SOLICITE ATENCIN MDICA SI:  Siente menos de 10 pataditas en 2 horas. Intntelo dos  veces.   No siente movimientos durante 1 hora.   El patrn se modifica o le lleva ms tiempo Art gallery manager las 10 pataditas.   Siente que el beb no se mueve como lo hace habitualmente.  Fecha: ____________ Movimientos: ____________ Comienzo hora: ____________ [name]: ____________ Laurie Barnett: ____________ Movimientos: ____________ Comienzo hora: ____________ [name]: ____________ Laurie Barnett: ____________ Movimientos: ____________ Comienzo hora: ____________ [name]: ____________ Laurie Barnett: ____________ Movimientos: ____________ Comienzo hora: ____________ [name]: ____________ Laurie Barnett: ____________ Movimientos: ____________ Comienzo hora: ____________ [name]: ____________ Laurie Barnett: ____________ Movimientos: ____________ Comienzo hora: ____________ [name]: ____________ Laurie Barnett: ____________ Movimientos: ____________ Comienzo hora: ____________ [name]: ____________  Laurie Barnett: ____________ Movimientos: ____________ Comienzo hora: ____________ [name]: ____________ Laurie Barnett: ____________ Movimientos: ____________ Comienzo hora: ____________ [name]: ____________ Laurie Barnett: ____________ Movimientos: ____________ Comienzo hora: ____________ [name]: ____________ Laurie Barnett: ____________ Movimientos: ____________ Comienzo hora: ____________ [name]: ____________ Laurie Barnett: ____________ Movimientos: ____________ Comienzo hora: ____________ [name]: ____________ Laurie Barnett: ____________ Movimientos: ____________ Comienzo hora: ____________ [name]: ____________ Laurie Barnett: ____________ Movimientos: ____________ Comienzo hora: ____________ [name]: ____________  Laurie Barnett: ____________ Movimientos: ____________ Comienzo hora: ____________ [name]: ____________ Laurie Barnett: ____________ Movimientos: ____________ Comienzo hora: ____________ [name]: ____________ Laurie Barnett: ____________ Movimientos: ____________ Comienzo hora: ____________ [name]: ____________ Laurie Barnett: ____________ Movimientos: ____________ Comienzo hora: ____________ [name]:  ____________ Laurie Barnett: ____________ Movimientos: ____________ Comienzo hora: ____________ [name]: ____________ Laurie Barnett: ____________ Movimientos: ____________ Comienzo hora: ____________ [name]: ____________ Laurie Barnett: ____________ Movimientos: ____________ Comienzo hora: ____________ [name]: ____________  Laurie Barnett: ____________ Movimientos: ____________ Comienzo hora: ____________ [name]: ____________ Laurie Barnett: ____________ Movimientos: ____________  Comienzo hora: ____________ [name]: ____________ Laurie Barnett: ____________ Movimientos: ____________ Comienzo hora: ____________ [name]: ____________ Laurie Barnett: ____________ Movimientos: ____________ Comienzo hora: ____________ [name]: ____________ Laurie Barnett: ____________ Movimientos: ____________ Comienzo hora: ____________ [name]: ____________ Laurie Barnett: ____________ Movimientos: ____________ Comienzo hora: ____________ [name]: ____________ Laurie Barnett: ____________ Movimientos: ____________ Comienzo hora: ____________ [name]: ____________  Laurie Barnett: ____________ Movimientos: ____________ Comienzo hora: ____________ [name]: ____________ Laurie Barnett: ____________ Movimientos: ____________ Comienzo hora: ____________ [name]: ____________ Laurie Barnett: ____________ Movimientos: ____________ Comienzo hora: ____________ [name]: ____________ Laurie Barnett: ____________ Movimientos: ____________ Comienzo hora: ____________ [name]: ____________ Laurie Barnett: ____________ Movimientos: ____________ Comienzo hora: ____________ [name]: ____________ Laurie Barnett: ____________ Movimientos: ____________ Comienzo hora: ____________ [name]: ____________ Laurie Barnett: ____________ Movimientos: ____________ Comienzo hora: ____________ [name]: ____________  Laurie Barnett: ____________ Movimientos: ____________ Comienzo hora: ____________ [name]: ____________ Laurie Barnett: ____________ Movimientos: ____________ Comienzo hora: ____________ [name]: ____________ Laurie Barnett: ____________ Movimientos: ____________ Comienzo hora:  ____________ [name]: ____________ Laurie Barnett: ____________ Movimientos: ____________ Comienzo hora: ____________ [name]: ____________ Laurie Barnett: ____________ Movimientos: ____________ Comienzo hora: ____________ [name]: ____________ Laurie Barnett: ____________ Movimientos: ____________ Comienzo hora: ____________ [name]: ____________ Laurie Barnett: ____________ Movimientos: ____________ Comienzo hora: ____________ [name]: ____________  Laurie Barnett: ____________ Movimientos: ____________ Comienzo hora: ____________ [name]: ____________ Laurie Barnett: ____________ Movimientos: ____________ Comienzo hora: ____________ [name]: ____________ Laurie Barnett: ____________ Movimientos: ____________ Comienzo hora: ____________ [name]: ____________ Laurie Barnett: ____________ Movimientos: ____________ Comienzo hora: ____________ [name]: ____________ Laurie Barnett: ____________ Movimientos: ____________ Comienzo hora: ____________ [name]: ____________ Laurie Barnett: ____________ Movimientos: ____________ Comienzo hora: ____________ [name]: ____________ Laurie Barnett: ____________ Movimientos: ____________ Comienzo hora: ____________ [name]: ____________  Laurie Barnett: ____________ Movimientos: ____________ Comienzo hora: ____________ [name]: ____________ Laurie Barnett: ____________ Movimientos: ____________ Comienzo hora: ____________ [name]: ____________ Laurie Barnett: ____________ Movimientos: ____________ Comienzo hora: ____________ [name]: ____________ Laurie Barnett: ____________ Movimientos: ____________ Comienzo hora: ____________ [name]: ____________ Laurie Barnett: ____________ Movimientos: ____________ Comienzo hora: ____________ [name]: ____________ Laurie Barnett: ____________ Movimientos: ____________ Comienzo hora: ____________ [name]: ____________ Laurie Barnett: ____________ Movimientos: ____________ Comienzo hora: ____________ Fin hora: ____________  Document Released: 01/15/2008 Document Revised: 09/27/2011 ExitCare Patient Information 2012 [place], [place].

## 2012-01-10 NOTE — Progress Notes (Signed)
S: 31 year old G3P2002 at [redacted]w[redacted]d (EDD 03/21/2012).  Concerns today include left sided burning pain.  This is constant, localized over the lateral 11th rib, no radiation, not associated with food or position.  Worse with activity, better with rest, but never completely resolves.  Tylenol not helping.  States "feels like a burn." No rashes or trauma.  Same as pain in previous pregnancy.  No history of chicken pox.  No contractions, no vaginal bleeding, discharge, no dysuria or urinary changes.  Good fetal movement.  O: see flowsheet Ext: no edema Abd: soft, no organomegaly, gravid, non-tender, no rashes, no left kidney tenderness  A/P: 31 yo G3P2002 at [redacted]w[redacted]d -continue prenatal -weight gain appropriate -labs reviewed and up to date -left sided pain not consistent with MSK, heartburn, ligament pain; seems more neuropathic; will try capsaicin cream TID prn -kick counts and labor precautions reviewed -follow up in 2 weeks

## 2012-01-10 NOTE — Progress Notes (Signed)
Patient interviewed in Spanish, visit conducted with Dr. Elwyn Reach in [facility] clinic.  I agree with the history and exam/plan as documented by Dr. Elwyn Reach.  Patient is being scheduled for OB follow up with her primary Dr. Durene Cal, in 2 weeks.  She already has seen OB regarding repeat C/S scheduling.  Husband plans vasectomy.  Paula Compton, MD

## 2012-01-28 ENCOUNTER — Encounter: Payer: Self-pay | Admitting: Family Medicine

## 2012-02-14 ENCOUNTER — Ambulatory Visit (INDEPENDENT_AMBULATORY_CARE_PROVIDER_SITE_OTHER): Payer: Self-pay | Admitting: Family Medicine

## 2012-02-14 VITALS — BP 106/68 | Temp 98.3°F | Wt 126.0 lb

## 2012-02-14 DIAGNOSIS — Z3480 Encounter for supervision of other normal pregnancy, unspecified trimester: Secondary | ICD-10-CM

## 2012-02-14 DIAGNOSIS — Z3490 Encounter for supervision of normal pregnancy, unspecified, unspecified trimester: Secondary | ICD-10-CM

## 2012-02-14 NOTE — Patient Instructions (Signed)
Mtodo para Engineer, technical sales (Fetal Movement Counts) En los embarazos de alto riesgo se recomienda contar las pataditas, pero tambin es una buena idea que lo hagan todas las [place]. Comience a contarlas a las 28 semanas de embarazo. Los movimientos fetales aumentan luego de una comida Immunologist o de comer o beber algo dulce (el nivel de azcar en la sangre est ms alto). Tambin es importante beber gran cantidad de lquidos (hidratarse bien) antes de contar. Si se recuesta sobre el lado izquierdo mejorar la Designer, industrial/product, o puede sentarse en una silla cmoda con los brazos sobre el abdomen y sin distracciones que la rodeen. CONTANDO  Trate de contar a la [employer] lo haga.   Marque el da y la hora y vea cunto le lleva sentir 10 movimientos (patadas, agitaciones, sacudones, vueltas). Debe sentir al menos 10 movimientos en 2 horas. Probablemente sienta los 10 movimientos en menos de dos horas. Si no los siente, espere una hora y cuente nuevamente. Luego de [employer] tendr un patrn.   Debemos observar si hay cambios en el patrn o no hay suficientes pataditas en 2 horas. Le lleva ms tiempo contar los 10 movimientos?  SOLICITE ATENCIN MDICA SI:  Siente menos de 10 pataditas en 2 horas. Intntelo dos veces.   No siente movimientos durante 1 hora.   El patrn se modifica o le lleva ms tiempo Art gallery manager las 10 pataditas.   Siente que el beb no se mueve como lo hace habitualmente.  Fecha: ____________ Movimientos: ____________ Comienzo hora: ____________ Laurie Barnett: ____________  Laurie Barnett: ____________ Movimientos: ____________ Comienzo hora: ____________ Laurie Barnett: ____________ Laurie Barnett: ____________ Movimientos: ____________ Comienzo hora: ____________ Laurie Barnett: ____________ Laurie Barnett: ____________ Movimientos: ____________ Comienzo hora: ____________ Laurie Barnett: ____________ Laurie Barnett: ____________ Movimientos: ____________ Comienzo hora: ____________ Laurie Barnett:  ____________ Laurie Barnett: ____________ Movimientos: ____________ Comienzo hora: ____________ Laurie Barnett: ____________ Laurie Barnett: ____________ Movimientos: ____________ Comienzo hora: ____________ Laurie Barnett: ____________ Laurie Barnett: ____________ Movimientos: ____________ Comienzo hora: ____________ Laurie Barnett: ____________ Laurie Barnett: ____________ Movimientos: ____________ Comienzo hora: ____________ Laurie Barnett: ____________  Laurie Barnett: ____________ Movimientos: ____________ Comienzo hora: ____________ Laurie Barnett: ____________ Laurie Barnett: ____________ Movimientos: ____________ Comienzo hora: ____________ Laurie Barnett: ____________ Laurie Barnett: ____________ Movimientos: ____________ Comienzo hora: ____________ Laurie Barnett: ____________ Laurie Barnett: ____________ Movimientos: ____________ Comienzo hora: ____________ Laurie Barnett: ____________ Laurie Barnett: ____________ Movimientos: ____________ Comienzo hora: ____________ Laurie Barnett: ____________ Laurie Barnett: ____________ Movimientos: ____________ Comienzo hora: ____________ Laurie Barnett: ____________ Laurie Barnett: ____________ Movimientos: ____________ Comienzo hora: ____________ Laurie Barnett: ____________  Laurie Barnett: ____________ Movimientos: ____________ Comienzo hora: ____________ Fin hora: ____________  Document Released: 01/15/2008 Document Revised: 09/27/2011 ExitCare Patient Information 2012 [employer].

## 2012-02-14 NOTE — Progress Notes (Signed)
S: 31 year old G3P2002 at 91.6 (EDD 03/21/2012). Patient has no concerns except allergies has increased and does not know what she can take.  Good fetal movement. Regular bowel movements patient does state of C-section scheduled. Husband plans on vasectomy. Patient had 28 weeks labs done. Patient will return in 2 weeks time to have GBS done. Counts given and when to seek medical attention. Discussed Benadryl as it used to help her with sleeping as well as her allergies.

## 2012-02-26 ENCOUNTER — Other Ambulatory Visit (HOSPITAL_COMMUNITY)
Admission: RE | Admit: 2012-02-26 | Discharge: 2012-02-26 | Disposition: A | Discharge status: Home / Self Care | Payer: Self-pay | Source: Ambulatory Visit | Attending: Family Medicine | Admitting: Family Medicine

## 2012-02-26 ENCOUNTER — Ambulatory Visit (INDEPENDENT_AMBULATORY_CARE_PROVIDER_SITE_OTHER): Payer: Self-pay | Admitting: Family Medicine

## 2012-02-26 VITALS — BP 106/70 | Wt 131.0 lb

## 2012-02-26 DIAGNOSIS — Z3480 Encounter for supervision of other normal pregnancy, unspecified trimester: Secondary | ICD-10-CM

## 2012-02-26 DIAGNOSIS — R300 Dysuria: Secondary | ICD-10-CM

## 2012-02-26 DIAGNOSIS — Z113 Encounter for screening for infections with a predominantly sexual mode of transmission: Secondary | ICD-10-CM | POA: Insufficient documentation

## 2012-02-26 DIAGNOSIS — Z3490 Encounter for supervision of normal pregnancy, unspecified, unspecified trimester: Secondary | ICD-10-CM

## 2012-02-26 LAB — POCT URINALYSIS DIPSTICK
Glucose, UA: NEGATIVE
Ketones, UA: NEGATIVE
Spec Grav, UA: 1.015

## 2012-02-26 NOTE — Patient Instructions (Signed)
Parto por cesrea  (Cesarean Delivery) El parto por cesrea es el nacimiento de un feto a travs de un corte (incisin) en el vientre (abdomen) y en el matriz (tero).  HGALE SABER A SU MDICO SOBRE:   Complicaciones del embarazo.   Alergias.   Medicamentos que utiliza, incluyendo hierbas, gotas oftlmicas, medicamentos de venta libre y cremas.   Uso de corticoides (por va oral o cremas).   Problemas anteriores debido a anestsicos o a medicamentos que disminuyen la sensibilidad.   Cirugas anteriores.   Historia de cogulos sanguneos   Problemas hemorrgicos o en la sangre.   Otros problemas de salud.  RIESGOS Y COMPLICACIONES  Hemorragias.   Infeccin.   Cogulos sanguneos.   Lesin en los rganos circundantes.   Problemas con la anestesia.   Lesiones al beb.  ANTES DEL PROCEDIMIENTO   Le insertarn un tubo (catter Foley)en la vejiga. El catter Foley drena la orina desde la vejiga hacia una bolsa. Esto mantendr la vejiga vaca durante la ciruga.   Le colocarn un catter de acceso intravenoso (IV) en el brazo.   Luego rasurarn la zona del pubis y la parte inferior del abdomen. Esto se realiza para evitar una infeccin en el sitio de la incisin.   Le administrarn un medicamento anticido. Esto impedir que los contenidos cidos del estmago ingresen a los pulmones si vomita durante el procedimiento.   Le podrn dar antibiticos para prevenir infecciones.  PROCEDIMIENTO   Le administrarn un medicamento para adormecer a zona inferior del cuerpo anestesia regional). Si estuviera en trabajo de parto, podrn aplicarle una anestesia epidural, que se utiliza tanto el el trabajo de parto como en la cesrea. Puede ser que le administren un medicamento que la har dormir (anestesia general), aunque esto no es tan frecuente.   Le harn una incisin en el abdomen que se extiende hacia el tero. Hay dos tipos bsicos de incisin:   La incisin horizontal  (transversa) Las incisiones horizontales se realizan en la mayor parte de las cesreas de rutina.   La incisin vertical (de arriba hacia abajo). Esta es la menos frecuente. Se reserva para aquellas mujeres que tienen una complicacin grave (parto prematuro extremo) o bajo situaciones de emergencia.   Las incisiones horizontales y verticales pueden utilizarse ambas al mismo tiempo. Sin embargo, no es muy frecuente.   El beb nacer.  DESPUS DEL PROCEDIMIENTO   Si estuvo despierta durante la ciruga, podr ver al beb enseguida. Si la duermen, ver al beb tan pronto como despierte.   Podr amamantar a su beb despus del procedimiento.   Podr levantarse y caminar el mismo da de la ciruga. Si debe permanecer en cama durante cierto tiempo, recibir ayuda para darse vuelta, toser y respirar profundamente despus de la ciruga. Esto ayuda a evitar complicaciones en los pulmones, como la neumona.   No se levante de la cama sola la primera vez luego de la ciruga. Necesitar ayuda para levantarse de la cama hasta que pueda hacerlo sola.   Podr darse una ducha el da siguiente a la ciruga. Despus que le quiten el vendaje, un enfermero la ayudar a ducharse.   Tendr unas medias compresivas neumticas en los pies o en la zona inferior de las piernas. Se colocan para prevenir cogulos sanguneos. Cuando se levante y camine regularmente, ya no sern necesarias.   Nocruce las piernas al sentarse.   Si elimina cogulos de sangre, gurdelos. Si elimina un cogulo cuando va al bao, por favor no tire la   cadena. Llame al enfermero. Comunquele al enfermero si piensa que tiene demasiada hemorragia o que elimina muchos cogulos.   Comience a beber lquidos y a alimentarse segn las indicaciones del mdico. Si el estmago no est en condiciones,comer y beber demasiado pronto puede causar aumento de la hinchazn y la inflamacin del intestino o el abdomen. Esto es muy molesto.   Le darn  medicamentos si los necesita. Dgale a los profesionales si siente dolor. Ellos tratarn de que se sienta cmoda. Tambin le indicarn antibiticos para prevenir una infeccin.   Le quitarn la va intravenosa cuando beba una cantidad razonable de lquido. El catter Foley se retirar cuando se levante y camine.   Si su tipo sanguneo es Rh negativo y el beb es Rh positivo, le darn una inyeccin de inmunoblobulina anti D. Esta inyeccin evita que tenga problemas con el Rh en embarazos futuros. Deber colocarse la inyeccin an si se ha hecho ligar las trompas.   Si le permiten llevar al beb a dar un paseo,colquelo en la cunita y empjela. No lleve al beb en sus brazos.  Document Released: 10/08/2005 Document Revised: 09/27/2011 ExitCare Patient Information 2012 [employer]. 

## 2012-02-26 NOTE — Progress Notes (Signed)
S: 31 year old G3P2002 @ 48.4, here for routine OB visit.  She has complaints of some burning with urination intermittently over the past week or two.  She otherwise reports good fetal movement, and every now and again a few irregular contractions.  She has her c-section is scheduled.    O: See flowsheet A/P:  - GBS/GC/Chlamydia done today - Will check UA to evaluate dysuria - Fetal kick counts reviewed - Labor precautions - Patient plans to breast feed, husband will get vasectomy for birth control - follow up in one week.

## 2012-03-06 ENCOUNTER — Encounter: Payer: Self-pay | Admitting: Family Medicine

## 2012-03-10 ENCOUNTER — Encounter (HOSPITAL_COMMUNITY)
Admission: RE | Admit: 2012-03-10 | Discharge: 2012-03-10 | Disposition: A | Discharge status: Home / Self Care | Payer: Medicaid Other | Source: Ambulatory Visit | Attending: Obstetrics and Gynecology | Admitting: Obstetrics and Gynecology

## 2012-03-10 ENCOUNTER — Ambulatory Visit (INDEPENDENT_AMBULATORY_CARE_PROVIDER_SITE_OTHER): Payer: Self-pay | Admitting: Family Medicine

## 2012-03-10 ENCOUNTER — Encounter (HOSPITAL_COMMUNITY): Payer: Self-pay

## 2012-03-10 ENCOUNTER — Encounter: Payer: Self-pay | Admitting: Family Medicine

## 2012-03-10 VITALS — BP 114/73 | Wt 133.0 lb

## 2012-03-10 DIAGNOSIS — Z3480 Encounter for supervision of other normal pregnancy, unspecified trimester: Secondary | ICD-10-CM

## 2012-03-10 DIAGNOSIS — Z3490 Encounter for supervision of normal pregnancy, unspecified, unspecified trimester: Secondary | ICD-10-CM

## 2012-03-10 HISTORY — DX: Other specified health status: Z78.9

## 2012-03-10 LAB — POCT URINALYSIS DIPSTICK
Bilirubin, UA: NEGATIVE
Nitrite, UA: NEGATIVE

## 2012-03-10 LAB — CBC
Hemoglobin: 12 g/dL (ref 12.0–15.0)
MCH: 28.8 pg (ref 26.0–34.0)
MCHC: 32.8 g/dL (ref 30.0–36.0)
MCV: 88 fL (ref 78.0–100.0)

## 2012-03-10 LAB — POCT UA - MICROSCOPIC ONLY

## 2012-03-10 LAB — SURGICAL PCR SCREEN: MRSA, PCR: NEGATIVE

## 2012-03-10 NOTE — Patient Instructions (Addendum)
Dear Laurie Barnett,   It was great to see you today! I am excited that I will get to come to your delivery on Friday. I have really enjoyed being your doctor during this pregnancy.  I would like to send you to the lab to get some urine due to the swelling in your legs. I am looking for protein in your urine. If I see any protein, I may call you to get some additional tests, but I do not think this is likely. Remember that you should go to the hospital if you are feeling baby move less (see below), bleeding, having leaking fluid, or contractions that are becoming more regular.   Please follow up in clinic in 6 weeks after your delivery. Please call earlier if you have any questions or concerns.   Sincerely,  Dr. Tana Conch  Mtodo para contar los movimientos fetales (Fetal Movement Counts) Nombre de la paciente: __________________________________________________ Laurie Barnett probable de parto:____________________ En los embarazos de alto riesgo se recomienda contar las pataditas, pero tambin es una buena idea que lo hagan todas las [place]. Comience a contarlas a las 28 semanas de embarazo. Los movimientos fetales aumentan luego de una comida Immunologist o de comer o beber algo dulce (el nivel de azcar en la sangre est ms alto). Tambin es importante beber gran cantidad de lquidos (hidratarse bien) antes de contar. Si se recuesta sobre el lado izquierdo mejorar la Designer, industrial/product, o puede sentarse en una silla cmoda con los brazos sobre el abdomen y sin distracciones que la rodeen. CONTANDO  Trate de contar a la [employer] lo haga.   Marque el da y la hora y vea cunto le lleva sentir 10 movimientos (patadas, agitaciones, sacudones, vueltas). Debe sentir al menos 10 movimientos en 2 horas. Probablemente sienta los 10 movimientos en menos de dos horas. Si no los siente, espere una hora y cuente nuevamente. Luego de [employer] tendr un patrn.   Debemos observar si  hay cambios en el patrn o no hay suficientes pataditas en 2 horas. Le lleva ms tiempo contar los 10 movimientos?  SOLICITE ATENCIN MDICA SI:  Siente menos de 10 pataditas en 2 horas. Intntelo dos veces.   No siente movimientos durante 1 hora.   El patrn se modifica o le lleva ms tiempo Art gallery manager las 10 pataditas.   Siente que el beb no se mueve como lo hace habitualmente.  Fecha: ____________ Movimientos: ____________ Comienzo hora: ____________ [name]: ____________ Laurie Barnett: ____________ Movimientos: ____________ Comienzo hora: ____________ [name]: ____________ Laurie Barnett: ____________ Movimientos: ____________ Comienzo hora: ____________ [name]: ____________ Laurie Barnett: ____________ Movimientos: ____________ Comienzo hora: ____________ [name]: ____________ Laurie Barnett: ____________ Movimientos: ____________ Comienzo hora: ____________ [name]: ____________ Laurie Barnett: ____________ Movimientos: ____________ Comienzo hora: ____________ [name]: ____________ Laurie Barnett: ____________ Movimientos: ____________ Comienzo hora: ____________ [name]: ____________  Laurie Barnett: ____________ Movimientos: ____________ Comienzo hora: ____________ [name]: ____________ Laurie Barnett: ____________ Movimientos: ____________ Comienzo hora: ____________ [name]: ____________ Laurie Barnett: ____________ Movimientos: ____________ Comienzo hora: ____________ [name]: ____________ Laurie Barnett: ____________ Movimientos: ____________ Comienzo hora: ____________ [name]: ____________ Laurie Barnett: ____________ Movimientos: ____________ Comienzo hora: ____________ [name]: ____________ Laurie Barnett: ____________ Movimientos: ____________ Comienzo hora: ____________ [name]: ____________ Laurie Barnett: ____________ Movimientos: ____________ Comienzo hora: ____________ [name]: ____________  Laurie Barnett: ____________ Movimientos: ____________ Comienzo hora: ____________ [name]: ____________ Laurie Barnett: ____________ Movimientos: ____________ Comienzo hora: ____________  [name]: ____________ Laurie Barnett: ____________ Movimientos: ____________ Comienzo hora: ____________ [name]: ____________ Laurie Barnett: ____________ Movimientos: ____________ Comienzo hora: ____________ Daylene Posey  hora: ____________ Laurie Barnett: ____________ Movimientos: ____________ Comienzo hora: ____________ [name]: ____________ Laurie Barnett: ____________ Movimientos: ____________ Comienzo hora: ____________ [name]: ____________ Laurie Barnett: ____________ Movimientos: ____________ Comienzo hora: ____________ [name]: ____________  Laurie Barnett: ____________ Movimientos: ____________ Comienzo hora: ____________ [name]: ____________ Laurie Barnett: ____________ Movimientos: ____________ Comienzo hora: ____________ [name]: ____________ Laurie Barnett: ____________ Movimientos: ____________ Comienzo hora: ____________ [name]: ____________ Laurie Barnett: ____________ Movimientos: ____________ Comienzo hora: ____________ [name]: ____________ Laurie Barnett: ____________ Movimientos: ____________ Comienzo hora: ____________ [name]: ____________ Laurie Barnett: ____________ Movimientos: ____________ Comienzo hora: ____________ [name]: ____________ Laurie Barnett: ____________ Movimientos: ____________ Comienzo hora: ____________ [name]: ____________  Laurie Barnett: ____________ Movimientos: ____________ Comienzo hora: ____________ [name]: ____________ Laurie Barnett: ____________ Movimientos: ____________ Comienzo hora: ____________ [name]: ____________ Laurie Barnett: ____________ Movimientos: ____________ Comienzo hora: ____________ [name]: ____________ Laurie Barnett: ____________ Movimientos: ____________ Comienzo hora: ____________ [name]: ____________ Laurie Barnett: ____________ Movimientos: ____________ Comienzo hora: ____________ [name]: ____________ Laurie Barnett: ____________ Movimientos: ____________ Comienzo hora: ____________ [name]: ____________ Laurie Barnett: ____________ Movimientos: ____________ Comienzo hora: ____________ [name]: ____________  Laurie Barnett: ____________ Movimientos: ____________ Comienzo hora:  ____________ [name]: ____________ Laurie Barnett: ____________ Movimientos: ____________ Comienzo hora: ____________ [name]: ____________ Laurie Barnett: ____________ Movimientos: ____________ Comienzo hora: ____________ [name]: ____________ Laurie Barnett: ____________ Movimientos: ____________ Comienzo hora: ____________ [name]: ____________ Laurie Barnett: ____________ Movimientos: ____________ Comienzo hora: ____________ [name]: ____________ Laurie Barnett: ____________ Movimientos: ____________ Comienzo hora: ____________ [name]: ____________ Laurie Barnett: ____________ Movimientos: ____________ Comienzo hora: ____________ [name]: ____________  Laurie Barnett: ____________ Movimientos: ____________ Comienzo hora: ____________ [name]: ____________ Laurie Barnett: ____________ Movimientos: ____________ Comienzo hora: ____________ [name]: ____________ Laurie Barnett: ____________ Movimientos: ____________ Comienzo hora: ____________ [name]: ____________ Laurie Barnett: ____________ Movimientos: ____________ Comienzo hora: ____________ [name]: ____________ Laurie Barnett: ____________ Movimientos: ____________ Comienzo hora: ____________ [name]: ____________ Laurie Barnett: ____________ Movimientos: ____________ Comienzo hora: ____________ [name]: ____________ Laurie Barnett: ____________ Movimientos: ____________ Comienzo hora: ____________ [name]: ____________  Laurie Barnett: ____________ Movimientos: ____________ Comienzo hora: ____________ [name]: ____________ Laurie Barnett: ____________ Movimientos: ____________ Comienzo hora: ____________ [name]: ____________ Laurie Barnett: ____________ Movimientos: ____________ Comienzo hora: ____________ [name]: ____________ Laurie Barnett: ____________ Movimientos: ____________ Comienzo hora: ____________ [name]: ____________ Laurie Barnett: ____________ Movimientos: ____________ Comienzo hora: ____________ [name]: ____________ Laurie Barnett: ____________ Movimientos: ____________ Comienzo hora: ____________ [name]: ____________ Laurie Barnett: ____________ Movimientos: ____________  Comienzo hora: ____________ Fin hora: ____________  Document Released: 01/15/2008 Document Revised: 09/27/2011 ExitCare Patient Information 2012 [place], [place].

## 2012-03-10 NOTE — Patient Instructions (Addendum)
YOUR PROCEDURE IS SCHEDULED ON:03/15/11  ENTER THROUGH THE MAIN ENTRANCE OF [facility] AT:0730 am  USE DESK PHONE AND DIAL [phone] TO INFORM us OF YOUR ARRIVAL  CALL [phone] IF YOU HAVE ANY QUESTIONS OR PROBLEMS PRIOR TO YOUR ARRIVAL.  REMEMBER: DO NOT EAT OR DRINK AFTER MIDNIGHT : Thursday   YOU MAY BRUSH YOUR TEETH THE MORNING OF SURGERY   TAKE THESE MEDICINES THE DAY OF SURGERY WITH SIP OF WATER:none   DO NOT WEAR JEWELRY, EYE MAKEUP, LIPSTICK OR DARK FINGERNAIL POLISH DO NOT WEAR LOTIONS  DO NOT SHAVE FOR 48 HOURS PRIOR TO SURGERY  YOU WILL NOT BE ALLOWED TO DRIVE YOURSELF HOME.  NAME OF DRIVER:Juan- spouse

## 2012-03-11 NOTE — Progress Notes (Signed)
Says that she has had some slight swelling in her legs. Blood pressure also slightly elevated in comparison to previous values although not at worrisome level. UA obtained and only trace protein.  Had GBS/GC at last visit.  Contractions about 2-3x per day and not very painful. Says child is extremely active. Husband present-both seem very excited about c-section on Friday. Made patient aware that I will attend on Friday and see her through her stay.  Still plans on breastfeeding. [facility] for pediatrician. No circumcision.  Reviewed PTL and kick counts.

## 2012-03-14 ENCOUNTER — Inpatient Hospital Stay (HOSPITAL_COMMUNITY)
Admission: RE | Admit: 2012-03-14 | Discharge: 2012-03-16 | DRG: 766 | Disposition: A | Discharge status: Home / Self Care | Payer: Medicaid Other | Source: Ambulatory Visit | Attending: Obstetrics and Gynecology | Admitting: Obstetrics and Gynecology

## 2012-03-14 ENCOUNTER — Encounter (HOSPITAL_COMMUNITY): Payer: Self-pay | Admitting: *Deleted

## 2012-03-14 ENCOUNTER — Inpatient Hospital Stay (HOSPITAL_COMMUNITY): Payer: Medicaid Other | Admitting: Anesthesiology

## 2012-03-14 ENCOUNTER — Encounter (HOSPITAL_COMMUNITY)
Admission: RE | Disposition: A | Discharge status: Home / Self Care | Payer: Self-pay | Source: Ambulatory Visit | Attending: Obstetrics and Gynecology

## 2012-03-14 ENCOUNTER — Encounter (HOSPITAL_COMMUNITY): Payer: Self-pay | Admitting: General Surgery

## 2012-03-14 ENCOUNTER — Encounter (HOSPITAL_COMMUNITY): Payer: Self-pay | Admitting: Anesthesiology

## 2012-03-14 DIAGNOSIS — O34219 Maternal care for unspecified type scar from previous cesarean delivery: Secondary | ICD-10-CM

## 2012-03-14 DIAGNOSIS — Z3490 Encounter for supervision of normal pregnancy, unspecified, unspecified trimester: Secondary | ICD-10-CM

## 2012-03-14 LAB — TYPE AND SCREEN: Antibody Screen: NEGATIVE

## 2012-03-14 SURGERY — Surgical Case
Anesthesia: Epidural | Site: Abdomen | Wound class: Clean Contaminated

## 2012-03-14 MED ORDER — CEFAZOLIN SODIUM-DEXTROSE 2-3 GM-% IV SOLR
2.0000 g | INTRAVENOUS | Status: AC
  Administered 2012-03-14: 2 g via INTRAVENOUS
  Filled 2012-03-14: qty 50

## 2012-03-14 MED ORDER — SIMETHICONE 80 MG PO CHEW
80.0000 mg | CHEWABLE_TABLET | ORAL | Status: DC | PRN
  Administered 2012-03-15: 80 mg via ORAL

## 2012-03-14 MED ORDER — ONDANSETRON HCL 4 MG/2ML IJ SOLN
INTRAMUSCULAR | Status: DC | PRN
  Administered 2012-03-14: 4 mg via INTRAVENOUS

## 2012-03-14 MED ORDER — METOCLOPRAMIDE HCL 5 MG/ML IJ SOLN
INTRAMUSCULAR | Status: AC
  Administered 2012-03-14: 10 mg via INTRAVENOUS
  Filled 2012-03-14: qty 2

## 2012-03-14 MED ORDER — OXYCODONE-ACETAMINOPHEN 5-325 MG PO TABS
1.0000 | ORAL_TABLET | ORAL | Status: DC | PRN
  Administered 2012-03-15 – 2012-03-16 (×2): 1 via ORAL
  Filled 2012-03-14 (×2): qty 1

## 2012-03-14 MED ORDER — KETOROLAC TROMETHAMINE 30 MG/ML IJ SOLN
30.0000 mg | Freq: Four times a day (QID) | INTRAMUSCULAR | Status: AC | PRN
  Administered 2012-03-14: 30 mg via INTRAVENOUS
  Filled 2012-03-14: qty 1

## 2012-03-14 MED ORDER — FENTANYL CITRATE 0.05 MG/ML IJ SOLN: 25.0000 ug | INTRAMUSCULAR | Status: DC | PRN

## 2012-03-14 MED ORDER — CEFAZOLIN SODIUM-DEXTROSE 2-3 GM-% IV SOLR: 2.0000 g | INTRAVENOUS | Status: DC

## 2012-03-14 MED ORDER — ONDANSETRON HCL 4 MG/2ML IJ SOLN
INTRAMUSCULAR | Status: AC
  Filled 2012-03-14: qty 2

## 2012-03-14 MED ORDER — LACTATED RINGERS IV SOLN: INTRAVENOUS | Status: DC

## 2012-03-14 MED ORDER — BUPIVACAINE IN DEXTROSE 0.75-8.25 % IT SOLN
INTRATHECAL | Status: DC | PRN
  Administered 2012-03-14: 1.4 mL via INTRATHECAL

## 2012-03-14 MED ORDER — EPHEDRINE SULFATE 50 MG/ML IJ SOLN
INTRAMUSCULAR | Status: DC | PRN
  Administered 2012-03-14 (×3): 10 mg via INTRAVENOUS

## 2012-03-14 MED ORDER — PROMETHAZINE HCL 25 MG/ML IJ SOLN: 6.2500 mg | INTRAMUSCULAR | Status: DC | PRN

## 2012-03-14 MED ORDER — METOCLOPRAMIDE HCL 5 MG/ML IJ SOLN
10.0000 mg | Freq: Three times a day (TID) | INTRAMUSCULAR | Status: DC | PRN
  Administered 2012-03-14: 10 mg via INTRAVENOUS

## 2012-03-14 MED ORDER — MENTHOL 3 MG MT LOZG: 1.0000 | LOZENGE | OROMUCOSAL | Status: DC | PRN

## 2012-03-14 MED ORDER — ONDANSETRON HCL 4 MG PO TABS: 4.0000 mg | ORAL_TABLET | ORAL | Status: DC | PRN

## 2012-03-14 MED ORDER — SENNOSIDES-DOCUSATE SODIUM 8.6-50 MG PO TABS
2.0000 | ORAL_TABLET | Freq: Every day | ORAL | Status: DC
  Administered 2012-03-14 – 2012-03-15 (×2): 2 via ORAL

## 2012-03-14 MED ORDER — OXYTOCIN 10 UNIT/ML IJ SOLN
INTRAMUSCULAR | Status: AC
  Filled 2012-03-14: qty 4

## 2012-03-14 MED ORDER — DIBUCAINE 1 % RE OINT: 1.0000 "application " | TOPICAL_OINTMENT | RECTAL | Status: DC | PRN

## 2012-03-14 MED ORDER — OXYTOCIN 20 UNITS IN LACTATED RINGERS INFUSION - SIMPLE
INTRAVENOUS | Status: AC
  Filled 2012-03-14: qty 1000

## 2012-03-14 MED ORDER — ONDANSETRON HCL 4 MG/2ML IJ SOLN: 4.0000 mg | INTRAMUSCULAR | Status: DC | PRN

## 2012-03-14 MED ORDER — ACETAMINOPHEN 10 MG/ML IV SOLN
1000.0000 mg | Freq: Four times a day (QID) | INTRAVENOUS | Status: AC | PRN
  Filled 2012-03-14: qty 100

## 2012-03-14 MED ORDER — MIDAZOLAM HCL 2 MG/2ML IJ SOLN: 0.5000 mg | Freq: Once | INTRAMUSCULAR | Status: AC | PRN

## 2012-03-14 MED ORDER — MORPHINE SULFATE 0.5 MG/ML IJ SOLN
INTRAMUSCULAR | Status: AC
  Filled 2012-03-14: qty 10

## 2012-03-14 MED ORDER — DIPHENHYDRAMINE HCL 50 MG/ML IJ SOLN: 12.5000 mg | INTRAMUSCULAR | Status: DC | PRN

## 2012-03-14 MED ORDER — METHYLERGONOVINE MALEATE 0.2 MG/ML IJ SOLN
INTRAMUSCULAR | Status: DC | PRN
  Administered 2012-03-14: 0.2 mg via INTRAMUSCULAR

## 2012-03-14 MED ORDER — FENTANYL CITRATE 0.05 MG/ML IJ SOLN
INTRAMUSCULAR | Status: AC
  Filled 2012-03-14: qty 2

## 2012-03-14 MED ORDER — NALBUPHINE HCL 10 MG/ML IJ SOLN
5.0000 mg | INTRAMUSCULAR | Status: DC | PRN
  Filled 2012-03-14: qty 1

## 2012-03-14 MED ORDER — LANOLIN HYDROUS EX OINT: 1.0000 "application " | TOPICAL_OINTMENT | CUTANEOUS | Status: DC | PRN

## 2012-03-14 MED ORDER — DIPHENHYDRAMINE HCL 25 MG PO CAPS: 25.0000 mg | ORAL_CAPSULE | Freq: Four times a day (QID) | ORAL | Status: DC | PRN

## 2012-03-14 MED ORDER — SIMETHICONE 80 MG PO CHEW
80.0000 mg | CHEWABLE_TABLET | Freq: Three times a day (TID) | ORAL | Status: DC
  Administered 2012-03-14 – 2012-03-16 (×4): 80 mg via ORAL

## 2012-03-14 MED ORDER — DIPHENHYDRAMINE HCL 25 MG PO CAPS
25.0000 mg | ORAL_CAPSULE | ORAL | Status: DC | PRN
  Administered 2012-03-14: 25 mg via ORAL
  Filled 2012-03-14: qty 1

## 2012-03-14 MED ORDER — ONDANSETRON HCL 4 MG/2ML IJ SOLN: 4.0000 mg | Freq: Three times a day (TID) | INTRAMUSCULAR | Status: DC | PRN

## 2012-03-14 MED ORDER — SODIUM CHLORIDE 0.9 % IJ SOLN: 3.0000 mL | INTRAMUSCULAR | Status: DC | PRN

## 2012-03-14 MED ORDER — ACETAMINOPHEN 325 MG PO TABS: 325.0000 mg | ORAL_TABLET | ORAL | Status: DC | PRN

## 2012-03-14 MED ORDER — EPHEDRINE 5 MG/ML INJ
INTRAVENOUS | Status: AC
  Filled 2012-03-14: qty 10

## 2012-03-14 MED ORDER — ZOLPIDEM TARTRATE 5 MG PO TABS: 5.0000 mg | ORAL_TABLET | Freq: Every evening | ORAL | Status: DC | PRN

## 2012-03-14 MED ORDER — LACTATED RINGERS IV SOLN
INTRAVENOUS | Status: DC
  Administered 2012-03-14 (×3): via INTRAVENOUS

## 2012-03-14 MED ORDER — WITCH HAZEL-GLYCERIN EX PADS: 1.0000 "application " | MEDICATED_PAD | CUTANEOUS | Status: DC | PRN

## 2012-03-14 MED ORDER — MEPERIDINE HCL 25 MG/ML IJ SOLN: 6.2500 mg | INTRAMUSCULAR | Status: DC | PRN

## 2012-03-14 MED ORDER — DIPHENHYDRAMINE HCL 50 MG/ML IJ SOLN: 25.0000 mg | INTRAMUSCULAR | Status: DC | PRN

## 2012-03-14 MED ORDER — PRENATAL MULTIVITAMIN CH
1.0000 | ORAL_TABLET | Freq: Every day | ORAL | Status: DC
  Administered 2012-03-15 – 2012-03-16 (×2): 1 via ORAL
  Filled 2012-03-14 (×2): qty 1

## 2012-03-14 MED ORDER — KETOROLAC TROMETHAMINE 30 MG/ML IJ SOLN
30.0000 mg | Freq: Four times a day (QID) | INTRAMUSCULAR | Status: AC | PRN
  Administered 2012-03-14: 30 mg via INTRAMUSCULAR
  Filled 2012-03-14: qty 1

## 2012-03-14 MED ORDER — OXYTOCIN 10 UNIT/ML IJ SOLN
20.0000 [IU] | INTRAMUSCULAR | Status: DC | PRN
  Administered 2012-03-14: 20 [IU] via INTRAVENOUS

## 2012-03-14 MED ORDER — PHENYLEPHRINE 40 MCG/ML (10ML) SYRINGE FOR IV PUSH (FOR BLOOD PRESSURE SUPPORT)
PREFILLED_SYRINGE | INTRAVENOUS | Status: AC
  Filled 2012-03-14: qty 5

## 2012-03-14 MED ORDER — LACTATED RINGERS IV SOLN
INTRAVENOUS | Status: DC
  Administered 2012-03-14: 08:00:00 via INTRAVENOUS

## 2012-03-14 MED ORDER — IBUPROFEN 600 MG PO TABS
600.0000 mg | ORAL_TABLET | Freq: Four times a day (QID) | ORAL | Status: DC
  Administered 2012-03-14 – 2012-03-16 (×7): 600 mg via ORAL
  Filled 2012-03-14 (×6): qty 1

## 2012-03-14 MED ORDER — MORPHINE SULFATE (PF) 0.5 MG/ML IJ SOLN
INTRAMUSCULAR | Status: DC | PRN
  Administered 2012-03-14: .1 mg via INTRATHECAL

## 2012-03-14 MED ORDER — OXYTOCIN 20 UNITS IN LACTATED RINGERS INFUSION - SIMPLE: 125.0000 mL/h | INTRAVENOUS | Status: AC

## 2012-03-14 MED ORDER — SCOPOLAMINE 1 MG/3DAYS TD PT72
1.0000 | MEDICATED_PATCH | Freq: Once | TRANSDERMAL | Status: DC
  Filled 2012-03-14: qty 1

## 2012-03-14 MED ORDER — SCOPOLAMINE 1 MG/3DAYS TD PT72
1.0000 | MEDICATED_PATCH | Freq: Once | TRANSDERMAL | Status: DC
  Administered 2012-03-14: 1.5 mg via TRANSDERMAL

## 2012-03-14 MED ORDER — NALOXONE HCL 0.4 MG/ML IJ SOLN
1.0000 ug/kg/h | INTRAMUSCULAR | Status: DC | PRN
  Filled 2012-03-14: qty 2.5

## 2012-03-14 MED ORDER — TETANUS-DIPHTH-ACELL PERTUSSIS 5-2.5-18.5 LF-MCG/0.5 IM SUSP: 0.5000 mL | Freq: Once | INTRAMUSCULAR | Status: DC

## 2012-03-14 MED ORDER — METHYLERGONOVINE MALEATE 0.2 MG/ML IJ SOLN
INTRAMUSCULAR | Status: AC
  Filled 2012-03-14: qty 1

## 2012-03-14 MED ORDER — SCOPOLAMINE 1 MG/3DAYS TD PT72
MEDICATED_PATCH | TRANSDERMAL | Status: AC
  Administered 2012-03-14: 1.5 mg via TRANSDERMAL
  Filled 2012-03-14: qty 1

## 2012-03-14 MED ORDER — FENTANYL CITRATE 0.05 MG/ML IJ SOLN
INTRAMUSCULAR | Status: DC | PRN
  Administered 2012-03-14: 12.5 ug via INTRATHECAL
  Administered 2012-03-14: 50 ug via INTRAVENOUS
  Administered 2012-03-14: 37.5 ug via INTRAVENOUS

## 2012-03-14 MED ORDER — NALOXONE HCL 0.4 MG/ML IJ SOLN: 0.4000 mg | INTRAMUSCULAR | Status: DC | PRN

## 2012-03-14 SURGICAL SUPPLY — 17 items
CHLORAPREP W/TINT 26ML (MISCELLANEOUS) ×2 IMPLANT
DRESSING TELFA 8X3 (GAUZE/BANDAGES/DRESSINGS) ×2 IMPLANT
ELECT REM PT RETURN 9FT ADLT (ELECTROSURGICAL) ×2
ELECTRODE REM PT RTRN 9FT ADLT (ELECTROSURGICAL) ×1 IMPLANT
GAUZE SPONGE 4X4 12PLY STRL LF (GAUZE/BANDAGES/DRESSINGS) ×2 IMPLANT
GLOVE BIO SURGEON STRL SZ7.5 (GLOVE) ×4 IMPLANT
GLOVE BIOGEL PI IND STRL 6.5 (GLOVE) ×2 IMPLANT
GLOVE BIOGEL PI INDICATOR 6.5 (GLOVE) ×2
GLOVE SURG SS PI 6.0 STRL IVOR (GLOVE) ×2 IMPLANT
NS IRRIG 1000ML POUR BTL (IV SOLUTION) ×2 IMPLANT
PACK C SECTION WH (CUSTOM PROCEDURE TRAY) ×2 IMPLANT
PAD ABD 7.5X8 STRL (GAUZE/BANDAGES/DRESSINGS) ×2 IMPLANT
RTRCTR C-SECT PINK 25CM LRG (MISCELLANEOUS) ×2 IMPLANT
STAPLER VISISTAT 35W (STAPLE) ×2 IMPLANT
SUT VIC AB 0 CT1 36 (SUTURE) ×8 IMPLANT
TAPE CLOTH SURG 4X10 WHT LF (GAUZE/BANDAGES/DRESSINGS) ×2 IMPLANT
TRAY FOLEY CATH 14FR (SET/KITS/TRAYS/PACK) ×2 IMPLANT

## 2012-03-14 NOTE — Transfer of Care (Signed)
Immediate Anesthesia Transfer of Care Note  Patient: Laurie Barnett  Procedure(s) Performed: Procedure(s) (LRB): CESAREAN SECTION (N/A)  Patient Location: PACU  Anesthesia Type: Spinal  Level of Consciousness: awake, alert  and oriented  Airway & Oxygen Therapy: Patient Spontanous Breathing  Post-op Assessment: Report given to PACU RN and Post -op Vital signs reviewed and stable  Post vital signs: Reviewed and stable  Complications: No apparent anesthesia complications

## 2012-03-14 NOTE — Anesthesia Procedure Notes (Signed)
Spinal  Patient location during procedure: OR Start time: 03/14/2012 9:00 AM End time: 03/14/2012 9:04 AM Staffing Anesthesiologist: Sandrea Hughs Preanesthetic Checklist Completed: patient identified, site marked, surgical consent, pre-op evaluation, timeout performed, IV checked, risks and benefits discussed and monitors and equipment checked Spinal Block Patient position: sitting Prep: DuraPrep Patient monitoring: heart rate, cardiac monitor, continuous pulse ox and blood pressure Approach: midline Location: L3-4 Injection technique: single-shot Needle Needle type: Sprotte  Needle gauge: 24 G Needle length: 9 cm Needle insertion depth: 6 cm Assessment Sensory level: T4

## 2012-03-14 NOTE — H&P (Addendum)
Laurie Barnett is a 31 y.o. female [phone] with IUP at [redacted]w[redacted]d presenting for elective repeat cesarean section. Pt states she has been having no contractions, no vaginal bleeding, intact membranes, with active fetal movement.    Prenatal Course Source of Care: Short Family Practice  with onset of care at 15weeks Pregnancy complications or risks: Patient Active Problem List  Diagnoses  . DENTAL PAIN  . Supervision of normal pregnancy  . Language barrier-spanish speaking only. Requires interpreter.   . Susceptible to varicella (non-immune), currently pregnant   She desires for her husband to get a vasectomy.  She plans to plans to breastfeed  Prenatal labs and studies: ABO, Rh: O/POS/-- (12/07 0944) Antibody: NEG (12/07 0944) Rubella:  Immune RPR: NON REACTIVE (05/20 1009)  HBsAg: NEGATIVE (12/07 0944)  HIV: NON REACTIVE (02/25 1640)  GBS: NEGATIVE (05/07 1030)  1 hr Glucola: 119 Genetic screeningdeclined Anatomy US normal  Past Medical History:  Past Medical History  Diagnosis Date  . ABNORMAL FINDING ON ANTENATAL SCREENING 02/18/2010  . PREV C/S DELIV DELIV W/WO MENTION ANTPRTM COND 06/01/2010  . DENTAL PAIN 09/05/2010  . Supervision of normal pregnancy 10/05/2011  . Language barrier-spanish speaking only. Requires interpreter.  10/08/2011  . Susceptible to varicella (non-immune), currently pregnant 10/22/2011  . No pertinent past medical history     Past Surgical History:  Past Surgical History  Procedure Date  . Cesarean section     x 2    Obstetrical History:  OB History    Grav Para Term Preterm Abortions TAB SAB Ect Mult Living   3 2 2       2       Gynecological History:  OB History    Grav Para Term Preterm Abortions TAB SAB Ect Mult Living   3 2 2       2       Social History:  History   Social History  . Marital Status: Married    Spouse Name: N/A    Number of Children: N/A  . Years of Education: N/A   Social History Main Topics    . Smoking status: Never Smoker   . Smokeless tobacco: Not on file  . Alcohol Use: No  . Drug Use: No  . Sexually Active: Not on file   Other Topics Concern  . Not on file   Social History Narrative  . No narrative on file    Family History: No family history on file.  Medications:  Prenatal vitamins,  Current Facility-Administered Medications  Medication Dose Route Frequency Provider Last Rate Last Dose  . ceFAZolin (ANCEF) IVPB 2 g/50 mL premix  2 g Intravenous On Call to OR Levie Heritage, DO      . lactated ringers infusion   Intravenous Continuous Sandrea Hughs., MD      . lactated ringers infusion   Intravenous Continuous Levie Heritage, DO 125 mL/hr at 03/14/12 0801    . scopolamine (TRANSDERM-SCOP) 1.5 MG 1.5 mg  1 patch Transdermal Once Sandrea Hughs., MD   1.5 mg at 03/14/12 0801    Allergies: No Known Allergies  Review of Systems: - Negative  Physical Exam: Blood pressure 120/79, pulse 80, temperature 98.1 F (36.7 C), temperature source Oral, resp. rate 18, SpO2 100.00%. GENERAL: Well-developed, well-nourished female in no acute distress.  LUNGS: Clear to auscultation bilaterally.  HEART: Regular rate and rhythm. ABDOMEN: Soft, nontender, nondistended, gravid. EFW 7-7.5# lbs EXTREMITIES: Nontender, no edema, 2+ distal  pulses.    Pertinent Labs/Studies:  none  Assessment : Laurie Barnett is a 31 y.o. G3P2002 at [redacted]w[redacted]d being admitted for repeat cesarean section  Plan: The risks of cesarean section discussed with the patient included but were not limited to: bleeding which may require transfusion or reoperation; infection which may require antibiotics; injury to bowel, bladder, ureters or other surrounding organs; injury to the fetus; need for additional procedures including hysterectomy in the event of a life-threatening hemorrhage; placental abnormalities wth subsequent pregnancies, incisional problems, thromboembolic  phenomenon and other postoperative/anesthesia complications. The patient concurred with the proposed plan, giving informed written consent for the procedure.   She will remain NPO for procedure. Anesthesia and OR aware.  Preoperative prophylactic Ancef ordered on call to the OR.  To OR when ready.   Laurie Barnett 03/14/2012, 8:11 AM

## 2012-03-14 NOTE — Progress Notes (Signed)
UR Chart review completed.  

## 2012-03-14 NOTE — Addendum Note (Signed)
Addendum  created 03/14/12 1648 by Suella Grove, CRNA   Modules edited:Notes Section

## 2012-03-14 NOTE — Anesthesia Postprocedure Evaluation (Signed)
  Anesthesia Post-op Note  Patient: Laurie Barnett  Procedure(s) Performed: Procedure(s) (LRB): CESAREAN SECTION (N/A)  Patient Location: Mother/Baby  Anesthesia Type: Spinal  Level of Consciousness: awake  Airway and Oxygen Therapy: Patient Spontanous Breathing  Post-op Pain: mild  Post-op Assessment: Patient's Cardiovascular Status Stable, Respiratory Function Stable, Patent Airway, No signs of Nausea or vomiting, Adequate PO intake, Pain level controlled, No headache, No backache, No residual numbness and No residual motor weakness  Post-op Vital Signs: Reviewed and stable  Complications: No apparent anesthesia complications

## 2012-03-14 NOTE — OR Nursing (Signed)
With fundal massage, uterus was firm and expelled one small clot, but continued to trickle blood, after five minutes surgeon was called back to room.  Dr. Adrian Blackwater examined patient, and she was given methergen IM.

## 2012-03-14 NOTE — H&P (Signed)
Laurie Barnett is a 31 y.o. female presenting for scheduled repeat cesarean section. Patient is a [id number] with 2 previous cesarean section. Patient with uncomplicated prenatal care with family practice. Patient currently without complaints. History OB History    Grav Para Term Preterm Abortions TAB SAB Ect Mult Living   3 2 2       2      Past Medical History  Diagnosis Date  . ABNORMAL FINDING ON ANTENATAL SCREENING 02/18/2010  . PREV C/S DELIV DELIV W/WO MENTION ANTPRTM COND 06/01/2010  . DENTAL PAIN 09/05/2010  . Supervision of normal pregnancy 10/05/2011  . Language barrier-spanish speaking only. Requires interpreter.  10/08/2011  . Susceptible to varicella (non-immune), currently pregnant 10/22/2011  . No pertinent past medical history    Past Surgical History  Procedure Date  . Cesarean section     x 2   Family History: family history is not on file. Social History:  reports that she has never smoked. She does not have any smokeless tobacco history on file. She reports that she does not drink alcohol or use illicit drugs.  Review of Systems  All other systems reviewed and are negative.      Blood pressure 120/79, pulse 80, temperature 98.1 F (36.7 C), temperature source Oral, resp. rate 18, SpO2 100.00%. Exam Physical Exam   GENERAL: Well-developed, well-nourished female in no acute distress.  HEENT: Normocephalic, atraumatic. Sclerae anicteric.  NECK: Supple. Normal thyroid.  LUNGS: Clear to auscultation bilaterally.  HEART: Regular rate and rhythm. ABDOMEN: Soft, nontender, Gravid PELVIC: not indicated EXTREMITIES: No cyanosis, clubbing, or edema, 2+ distal pulses.  Prenatal labs: ABO, Rh: O/POS/-- (12/07 0944) Antibody: NEG (12/07 0944) Rubella: 142.8 (12/07 0944) RPR: NON REACTIVE (05/20 1009)  HBsAg: NEGATIVE (12/07 0944)  HIV: NON REACTIVE (02/25 1640)  GBS: NEGATIVE (05/07 1030)   Assessment/Plan: 31 yo G3p2002 AT 60 WEEKS for scheduled  repeat cesarean section. Risks, benefits and alternatives explained, including but not limited to risk of bleeding, infection and damage to adjacent organs. Patient verbalized understanding and all questions were answered. Patient's husband planning vasectomy for birth control method.  Patient with previous vertical skin incision and does not like the way it healed following both of her cesarean sections. Patient desires pfannenstiel incision.  Laurie Barnett 03/14/2012, 8:47 AM

## 2012-03-14 NOTE — Anesthesia Postprocedure Evaluation (Signed)
Anesthesia Post Note  Patient: Laurie Barnett  Procedure(s) Performed: Procedure(s) (LRB): CESAREAN SECTION (N/A)  Anesthesia type: Spinal  Patient location: PACU  Post pain: Pain level controlled  Post assessment: Post-op Vital signs reviewed  Last Vitals:  Filed Vitals:   03/14/12 1015  BP: 136/81  Pulse: 72  Temp: 36.7 C  Resp: 18    Post vital signs: Reviewed  Level of consciousness: awake  Complications: No apparent anesthesia complications

## 2012-03-14 NOTE — Anesthesia Preprocedure Evaluation (Signed)
Anesthesia Evaluation  Patient identified by MRN, date of birth, ID band Patient awake    Reviewed: Allergy & Precautions, H&P , Patient's Chart, lab work & pertinent test results  Airway Mallampati: II TM Distance: >3 FB Neck ROM: full    Dental No notable dental hx.    Pulmonary neg pulmonary ROS,  breath sounds clear to auscultation  Pulmonary exam normal       Cardiovascular negative cardio ROS  Rhythm:regular Rate:Normal     Neuro/Psych negative neurological ROS  negative psych ROS   GI/Hepatic negative GI ROS, Neg liver ROS,   Endo/Other  negative endocrine ROS  Renal/GU negative Renal ROS     Musculoskeletal   Abdominal   Peds  Hematology negative hematology ROS (+)   Anesthesia Other Findings ABNORMAL FINDING ON ANTENATAL SCREENING 02/18/2010   PREV C/S DELIV DELIV W/WO MENTION ANTPRTM COND 06/01/2010      DENTAL PAIN 09/05/2010   Supervision of normal pregnancy 10/05/2011      Language barrier-spanish speaking only. Requires interpreter.  10/08/2011   Susceptible to varicella (non-immune), currently pregnant 10/22/2011      No pertinent past medical history    Reproductive/Obstetrics (+) Pregnancy                           Anesthesia Physical Anesthesia Plan  ASA: II  Anesthesia Plan: Epidural   Post-op Pain Management:    Induction:   Airway Management Planned:   Additional Equipment:   Intra-op Plan:   Post-operative Plan:   Informed Consent: I have reviewed the patients History and Physical, chart, labs and discussed the procedure including the risks, benefits and alternatives for the proposed anesthesia with the patient or authorized representative who has indicated his/her understanding and acceptance.     Plan Discussed with:   Anesthesia Plan Comments:         Anesthesia Quick Evaluation

## 2012-03-14 NOTE — Op Note (Signed)
Laurie Barnett PROCEDURE DATE: 03/14/2012  PREOPERATIVE DIAGNOSIS: Intrauterine pregnancy at  [redacted]w[redacted]d weeks gestation; elective repeat  POSTOPERATIVE DIAGNOSIS: The same  PROCEDURE: Repeat Low Transverse Cesarean Section  SURGEON:  Dr. Catalina Antigua  ASSISTANT: Dr Candelaria Celeste  INDICATIONS: Laurie Barnett is a 31 y.o. G3P3003 at [redacted]w[redacted]d scheduled for cesarean section secondary to elective repeat.  The risks of cesarean section discussed with the patient included but were not limited to: bleeding which may require transfusion or reoperation; infection which may require antibiotics; injury to bowel, bladder, ureters or other surrounding organs; injury to the fetus; need for additional procedures including hysterectomy in the event of a life-threatening hemorrhage; placental abnormalities wth subsequent pregnancies, incisional problems, thromboembolic phenomenon and other postoperative/anesthesia complications. The patient concurred with the proposed plan, giving informed written consent for the procedure.    FINDINGS:  Viable female infant in cephalic presentation.  Apgars 9 and 9, weight pending.  Clear amniotic fluid.  Intact placenta, three vessel cord.  Normal uterus, fallopian tubes and ovaries bilaterally.  ANESTHESIA:    Spinal INTRAVENOUS FLUIDS:2300 ml ESTIMATED BLOOD LOSS: 800 ml URINE OUTPUT:  500 ml SPECIMENS: Placenta sent to pathology COMPLICATIONS: None immediate  PROCEDURE IN DETAIL:  The patient received intravenous antibiotics and had sequential compression devices applied to her lower extremities while in the preoperative area.  She was then taken to the operating room where spinal anesthesia was administered and was found to be adequate. She was then placed in a dorsal supine position with a leftward tilt, and prepped and draped in a sterile manner.  A foley catheter was placed into her bladder and attached to constant gravity, which drained clear fluid  throughout.  After an adequate timeout was performed, a Pfannenstiel skin incision was made with scalpel and carried through to the underlying layer of fascia. The fascia was incised in the midline and this incision was extended bilaterally using the Mayo scissors. Kocher clamps were applied to the superior aspect of the fascial incision and the underlying rectus muscles were dissected off bluntly. A similar process was carried out on the inferior aspect of the facial incision. The rectus muscles were separated in the midline bluntly and the peritoneum was entered bluntly. An Alexis retractor was placed.  Attention was turned to the lower uterine segment where a transverse hysterotomy was made with a scalpel and extended bilaterally bluntly. The Alexis retractor was then removed. The infant was successfully delivered, and cord was clamped and cut and infant was handed over to awaiting neonatology team. Uterine massage was then administered and the placenta delivered intact with three-vessel cord. The uterus was then cleared of clot and debris.  The hysterotomy was closed with 0 Vicryl in a running locked fashion, and an imbricating layer was also placed with a 0 Vicryl. Overall, excellent hemostasis was noted. The abdomen and the pelvis were cleared of all clot and debris. Hemostasis was confirmed on all surfaces.  The fascia was then closed using 0 Vicryl in a running fashion.  The subcutaneous layer was reapproximated with Vicryl and the skin was closed with staples. The patient tolerated the procedure well. Sponge, lap, instrument and needle counts were correct x 2. She was taken to the recovery room in stable condition.    Levie Heritage, DO 03/14/2012 10:01 AM

## 2012-03-15 LAB — CBC
Hemoglobin: 9.7 g/dL — ABNORMAL LOW (ref 12.0–15.0)
MCH: 29.4 pg (ref 26.0–34.0)
MCHC: 32.9 g/dL (ref 30.0–36.0)
MCV: 89.4 fL (ref 78.0–100.0)
Platelets: 156 10*3/uL (ref 150–400)
RBC: 3.3 MIL/uL — ABNORMAL LOW (ref 3.87–5.11)

## 2012-03-15 NOTE — Progress Notes (Signed)
Subjective: Postpartum Day 1: Cesarean Delivery Patient reports incisional pain, tolerating PO and no problems voiding.  Has not passed gas yet. Encouraged continued ambulation.   Objective: Vital signs in last 24 hours: Temp:  [98.3 F (36.8 C)-99.4 F (37.4 C)] 98.8 F (37.1 C) (05/25 0611) Pulse Rate:  [60-82] 60  (05/25 0611) Resp:  [16-18] 18  (05/25 0611) BP: (86-129)/(47-71) 102/66 mmHg (05/25 0611) SpO2:  [94 %-97 %] 96 % (05/25 1610)  Physical Exam:  General: alert and cooperative Lochia: appropriate Uterine Fundus: firm Incision: healing well, no significant drainage, no dehiscence, no significant erythema DVT Evaluation: No evidence of DVT seen on physical exam.   Basename 03/15/12 0546  HGB 9.7*  HCT 29.5*    Assessment/Plan: Status post Cesarean section. Doing well postoperatively. Has had almost a 2 point drop in hgb. No systemic signs. WIll continue to monitor-minimal lochia.  Continue current care. Patient expects to want to go home tomorrow.   Tana Conch 03/15/2012, 11:48 AM

## 2012-03-15 NOTE — Progress Notes (Signed)
POD #2  S. No complaints, ambulating, voiding, tolerating po without n/v  O. VSS, AF     Hbg- 96     Heart- rrr     Lungs- CTAB     Abd- benign     Uterus- firm and NT at u-1     Incision- C/D/I  A/P. POD # status post RLTC- doing well    Possible discharge home tomorrow.

## 2012-03-16 MED ORDER — IBUPROFEN 600 MG PO TABS: 600.0000 mg | ORAL_TABLET | Freq: Four times a day (QID) | ORAL | Status: AC

## 2012-03-16 MED ORDER — SENNOSIDES-DOCUSATE SODIUM 8.6-50 MG PO TABS: 2.0000 | ORAL_TABLET | Freq: Every day | ORAL | Status: DC

## 2012-03-16 MED ORDER — OXYCODONE-ACETAMINOPHEN 5-325 MG PO TABS: 1.0000 | ORAL_TABLET | Freq: Four times a day (QID) | ORAL | Status: AC | PRN

## 2012-03-16 NOTE — Discharge Summary (Signed)
Obstetric Discharge Summary Reason for Admission: cesarean section Prenatal Procedures: NST Intrapartum Procedures: cesarean: low cervical, transverse Postpartum Procedures: Methergine x1 Complications-Operative and Postpartum: minimal vaginal bleeding s/p c-section which resolved with Methergine x1 Hemoglobin  Date Value Range Status  03/15/2012 9.7* 12.0-15.0 (g/dL) Final     HCT  Date Value Range Status  03/15/2012 29.5* 36.0-46.0 (%) Final   Physical Exam:  General: alert and cooperative Lochia: appropriate Uterine Fundus: firm Incision: healing well DVT Evaluation: No evidence of DVT seen on physical exam.  Discharge Diagnoses: Term Pregnancy-delivered Laurie Barnett 31 y.o. female  [phone] who presented  for repeat C-section at 39 weeks. Patient with previous c-section with vertical incision but LTCS was performed per patient request. Patient did well postoperatively and was discharged home in stable condition with plans to follow up in 2 weeks for wound check and 6 weeks for postpartum visit with [facility].   Discharge Information: Date: 03/16/2012 Activity: pelvic rest Diet: routine Medications: PNV, Ibuprofen, Colace and Percocet Condition: stable Instructions: refer to practice specific booklet Discharge to: home   Newborn Data: Live born female  Birth Weight: 7 lb 0.2 oz (3180 g) APGAR: 9, 9  Home with mother.  Laurie Barnett 03/16/2012, 1:02 PM

## 2012-03-20 NOTE — Op Note (Signed)
Agree with above note.  [name] 03/20/2012 9:41 AM   

## 2012-03-27 ENCOUNTER — Ambulatory Visit: Payer: Self-pay | Admitting: Family Medicine

## 2012-03-28 ENCOUNTER — Ambulatory Visit (INDEPENDENT_AMBULATORY_CARE_PROVIDER_SITE_OTHER): Payer: Self-pay | Admitting: Family Medicine

## 2012-03-28 ENCOUNTER — Encounter: Payer: Self-pay | Admitting: Family Medicine

## 2012-03-28 VITALS — BP 117/80 | HR 73 | Temp 97.9°F | Ht 61.0 in | Wt 115.0 lb

## 2012-03-28 DIAGNOSIS — R300 Dysuria: Secondary | ICD-10-CM

## 2012-03-28 LAB — POCT URINALYSIS DIPSTICK
Bilirubin, UA: NEGATIVE
Ketones, UA: NEGATIVE
Protein, UA: 30
pH, UA: 6.5

## 2012-03-28 LAB — POCT UA - MICROSCOPIC ONLY

## 2012-03-28 MED ORDER — CEPHALEXIN 500 MG PO CAPS: 500.0000 mg | ORAL_CAPSULE | Freq: Two times a day (BID) | ORAL | Status: AC

## 2012-03-28 NOTE — Assessment & Plan Note (Addendum)
Patient with burning with urination. Possibly irritation from catheter. Check UA today. Sent in Keflex. Check urine culture. Call patient [phone]

## 2012-03-28 NOTE — Assessment & Plan Note (Signed)
Patient stable after C-section. See back in 4 weeks for postpartum check.

## 2012-03-28 NOTE — Patient Instructions (Addendum)
Please make an appt to see Dr. Durene Cal in 4 weeks Call if we can help you with anything  PLEASE CHANGE PCP TO DR Durene Cal  I will treat your urinary tract infection with an antibiotic.   If the antibiotic needs to change, I will call you when I get the result

## 2012-03-28 NOTE — Progress Notes (Signed)
  Subjective:    Patient ID: Laurie Barnett, female    DOB: 1981-08-28, 31 y.o.   MRN: [id number]  HPI  Patient presents today 2 weeks postpartum. She had a C-section and had staples removed by a nurse at home. She is breast-feeding. Her mood is good. She is having minimal lochia. She does not have very much pain.  She is having burning with urination occasionally. She says that when she drinks a lot of water the burning goes away.  Patient plans to have husband had vasectomy for birth control. Currently she is not having sex and she plans to use condoms until he is a vasectomy.  Review of Systems No fevers, chills. No depression or mood swings. No constipation.    Objective:   Physical Exam  Vital signs reviewed General appearance - alert, well appearing, and in no distress Incision-well healed, no areas of induration or redness. Mildly tender.      Assessment & Plan:

## 2012-03-29 LAB — GRAM STAIN: Gram Stain: NONE SEEN

## 2012-04-07 ENCOUNTER — Telehealth: Payer: Self-pay | Admitting: Family Medicine

## 2012-04-07 NOTE — Telephone Encounter (Signed)
Pt callled to know labs result from possible UTI. [employer]

## 2012-04-09 NOTE — Telephone Encounter (Signed)
Pt did have UTI.  Was ok to stay on the medication she was given.  Should follow up with PCP if still symptoms

## 2012-04-09 NOTE — Telephone Encounter (Signed)
[employer]ines, could you please call patient and let her know. Thanks!

## 2012-04-10 ENCOUNTER — Telehealth: Payer: Self-pay | Admitting: Family Medicine

## 2012-04-10 NOTE — Telephone Encounter (Signed)
Pt is aware about lab result.  [employer]

## 2012-04-28 ENCOUNTER — Ambulatory Visit (INDEPENDENT_AMBULATORY_CARE_PROVIDER_SITE_OTHER): Payer: Self-pay | Admitting: Family Medicine

## 2012-04-28 ENCOUNTER — Other Ambulatory Visit (HOSPITAL_COMMUNITY)
Admission: RE | Admit: 2012-04-28 | Discharge: 2012-04-28 | Disposition: A | Discharge status: Home / Self Care | Payer: Medicaid Other | Source: Ambulatory Visit | Attending: Family Medicine | Admitting: Family Medicine

## 2012-04-28 DIAGNOSIS — R300 Dysuria: Secondary | ICD-10-CM

## 2012-04-28 DIAGNOSIS — N760 Acute vaginitis: Secondary | ICD-10-CM

## 2012-04-28 DIAGNOSIS — Z113 Encounter for screening for infections with a predominantly sexual mode of transmission: Secondary | ICD-10-CM | POA: Insufficient documentation

## 2012-04-28 DIAGNOSIS — O09899 Supervision of other high risk pregnancies, unspecified trimester: Secondary | ICD-10-CM

## 2012-04-28 LAB — POCT WET PREP (WET MOUNT)

## 2012-04-28 LAB — POCT URINALYSIS DIPSTICK
Glucose, UA: NEGATIVE
Spec Grav, UA: 1.015
Urobilinogen, UA: 0.2

## 2012-04-28 NOTE — Patient Instructions (Signed)
Everything looks great today.  I am sorry you are having burning and discharge. We are going to check some tests. We will have our interpreter call you with results and any prescriptions that are needed.  Concerning the vasectomy, call [phone]. The health department does vasectomies on a sliding scale and it may even be free.  If you change your mind on birth control pills let [place] know as you can be seen here through November for free.   Thanks, Dr. Durene Cal

## 2012-04-29 ENCOUNTER — Encounter: Payer: Self-pay | Admitting: Family Medicine

## 2012-04-29 LAB — URINE CULTURE: Organism ID, Bacteria: NO GROWTH

## 2012-04-29 NOTE — Assessment & Plan Note (Signed)
Stable after c-section. Vasectomy resources given to patient for husband. No desire for birth control method outside of condoms/vasectomy. No signs postpartum depression.

## 2012-04-29 NOTE — Assessment & Plan Note (Signed)
Will discuss with preceptor and likely bring in for nurse visit as this was not covered during visit.

## 2012-04-29 NOTE — Assessment & Plan Note (Signed)
Discharge as well. Wet prep unrevealing. UA trace leukocytes, micro not performed as urine culture sent. Previous UTI with diphtheroids without susceptibilities but if corynebacterium should be covered by Keflex. GC/Chlamydia sent. No signs on exam of discharge or areas of irritation. Unclear etiology at this time, will follow studies.

## 2012-04-29 NOTE — Progress Notes (Signed)
  Subjective:    Patient ID: Laurie Barnett, female    DOB: 05-22-1981, 31 y.o.   MRN: [id number]  HPI  Postpartum visit  1. Depression screening-PHQ 9 score of 3 with 2 points for poor sleep which patient says is due to having a newborn.   2. Birth Control planning-interested in vasectomy for husband still-husband without regular doctor and interested in resources. Does not want birth control of any form including micronor, depo provera, or IUD  3. Breastfeeding-going well, occasionally uses formula  4. Dysuria, discharge-patient has had continued burning with urination despite treatment of her UTI at 2 week visit. Urine culture grew out diphtheroids at that time. No fevers, CVA tenderness, polyuria, polydipsia. She also started to have some yellow vaginal/clear discharge, not purulent 1.5 weeks ago after her bleeding stopped. She says it has a foul smell but can't describe it. No new sexual partners. Willing to get STI testing.   Review of Systems -See HPI  Past Medical History-smoking status noted: nonsmoker. Reviewed problem list.  Medications- reviewed and updated Chief complaint-noted      Objective:   Physical Exam  Constitutional: She is oriented to person, place, and time. She appears well-developed and well-nourished. No distress.  Neck: Normal range of motion. Neck supple.  Cardiovascular: Normal rate, regular rhythm and intact distal pulses.  Exam reveals no gallop and no friction rub.   No murmur heard. Pulmonary/Chest: Effort normal and breath sounds normal. She has no wheezes. She has no rales.  Abdominal: Soft. Bowel sounds are normal. She exhibits no distension. There is no tenderness.       Well healing transverse c-section scar without erythema or exudate  Genitourinary: Vagina normal and uterus normal. No vaginal discharge found.       No CMT.   Musculoskeletal: Normal range of motion. She exhibits no edema.  Neurological: She is alert and oriented to  person, place, and time.  Skin: Skin is warm and dry.      Assessment & Plan:

## 2013-05-04 ENCOUNTER — Ambulatory Visit (INDEPENDENT_AMBULATORY_CARE_PROVIDER_SITE_OTHER): Payer: Self-pay | Admitting: Family Medicine

## 2013-05-04 ENCOUNTER — Encounter: Payer: Self-pay | Admitting: Family Medicine

## 2013-05-04 VITALS — BP 129/85 | HR 82 | Ht 61.0 in | Wt 116.0 lb

## 2013-05-04 DIAGNOSIS — L299 Pruritus, unspecified: Secondary | ICD-10-CM | POA: Insufficient documentation

## 2013-05-04 DIAGNOSIS — L503 Dermatographic urticaria: Secondary | ICD-10-CM

## 2013-05-04 MED ORDER — CETIRIZINE HCL 10 MG PO TABS: 10.0000 mg | ORAL_TABLET | Freq: Every day | ORAL | Status: DC

## 2013-05-04 MED ORDER — DIPHENHYDRAMINE HCL 25 MG PO TABS: 25.0000 mg | ORAL_TABLET | Freq: Four times a day (QID) | ORAL | Status: DC | PRN

## 2013-05-04 NOTE — Patient Instructions (Addendum)
It was nice to see you today.   Today we discussed: 1. Dermagraphy Try starting with 1 tablet daily .  If this doesn't seem to help take 1 tablet twice daily.  Do not take more than 2 tablets daily. - cetirizine (ZYRTEC) 10 MG tablet; Take 1-2 tablets (10-20 mg total) by mouth daily.  Dispense: 60 tablet; Refill: 5   Please plan to return to see Dr. Mikel Cella to further discuss your periods and headaches.  If you need anything prior to seeing me please call the clinic.  Please Bring all medications with you to each appointment.  Cetirizine tablets Qu es este medicamento? La CETIRIZINA es un antihistamnico. Este medicamento se [place] para tratar o prevenir los sntomas de [place]. Tambin puede reducir la picazn de la piel en aquellas personas con erupciones urticantes o ronchas. Este medicamento puede ser utilizado para otros usos; si tiene alguna pregunta consulte con su proveedor de atencin mdica o con su farmacutico. Qu le debo informar a mi profesional de la salud antes de tomar este medicamento? Necesita saber si usted presenta alguno de los [employer] o situaciones: -enfermedad heptica -enfermedad renal -una reaccin alrgica o inusual a la cetirizina, otros medicamentos, alimentos, colorantes o conservadores -si est embarazada o buscando quedar embarazada -si est amamantando a un beb Cmo debo utilizar este medicamento? Tome este medicamento por va oral con un vaso de agua. Siga las instrucciones de la etiqueta del [place]. Este medicamento se puede tomar con alimentos o con el estmago vaco. Tome sus dosis a intervalos regulares. No lo tome con una frecuencia mayor que la indicada. Es posible que sea necesario que tome el medicamento durante varios das antes de que los sntomas mejoren. Hable con su pediatra para informarse acerca del uso de este medicamento en nios. Puede requerir atencin especial. Aunque este medicamento puede ser recetado a nios  tan menores como de 6 aos de edad para condiciones selectivas, las precauciones se aplican. Sobredosis: Pngase en contacto inmediatamente con un centro toxicolgico o una sala de urgencia si usted cree que haya tomado demasiado medicamento. ATENCIN: [employer] es solo para usted. No comparta este medicamento con nadie. Qu sucede si me olvido de una dosis? Si olvida una dosis, tmela lo antes posible. Si es casi la hora de la prxima dosis, tome slo esa dosis. No tome dosis adicionales o dobles. Qu puede interactuar con este medicamento? -otros medicamentos para resfros o Environmental consultant -teofilina Puede ser que esta lista no menciona todas las posibles interacciones. Informe a su profesional de [employer] de [employer] productos a base de hierbas, medicamentos de [place] o suplementos nutritivos que est tomando. Si usted fuma, consume bebidas alcohlicas o si utiliza drogas ilegales, indqueselo tambin a su profesional de [employer]. Algunas sustancias pueden interactuar con su medicamento. A qu debo estar atento al usar [employer]? Visite a su mdico o a su profesional de la salud para chequear su evolucin peridicamente. Consulte a su mdico si sus sntomas no mejoran. Puede experimentar mareos o somnolencia. No conduzca ni utilice maquinaria ni haga nada que Scientist, research (life sciences) en estado de alerta hasta que sepa cmo le afecta este medicamento. No se siente ni se ponga de pie con rapidez, especialmente si es un paciente de edad avanzada. Esto reduce el riesgo de mareos o [employer]. Se le podr secar la boca. Masticar chicle sin azcar, chupar caramelos duros y tomar agua en abundancia le ayudar a mantener la boca hmeda. Si el problema no desaparece  o es severo, consulte a su mdico. Qu efectos secundarios puedo tener al [employer] este medicamento? Efectos secundarios que debe informar a su mdico o a Producer, television/film/video de la salud tan pronto como sea posible: -Therapist, art  como erupcin cutnea, picazn o urticarias, hinchazn de la cara, labios o lengua -cambios en la visin o audicin -pulso cardaco rpido -alta presin sangunea -infeccin -dificultad para orinar o cambios en el volumen de orina Efectos secundarios que, por lo general, no requieren atencin mdica (debe informarlos a su mdico o a su profesional de la salud si persisten o si son molestos): -irritabilidad -dificultad para conciliar el sueo -dolor de garganta -dolor de Teaching laboratory technician -hinchazn Puede ser que esta lista no menciona todos los posibles efectos secundarios. Comunquese a su mdico por asesoramiento mdico [employer]. Usted puede informar los efectos secundarios a la FDA por telfono al 1-800-FDA-1088. Dnde debo guardar mi medicina? Mantngala fuera del alcance de los nios. Gurdela a [employer], entre 15 y 30 grados C (73 y 52 grados F). Deseche todo el medicamento que no haya utilizado, despus de la fecha de vencimiento. ATENCIN: Este folleto es un resumen. Puede ser que no cubra toda la posible informacin. Si usted tiene preguntas acerca de esta medicina, consulte con su mdico, su farmacutico o su profesional de Radiographer, therapeutic.  2013, Elsevier/Gold Standard. (06/12/2006 5:16:00 PM)

## 2013-05-04 NOTE — Assessment & Plan Note (Addendum)
Start sx treatment with Zyrtec daily to bid. - stop PNV as only medication not eliminated - no longer breast feeding - no airway involvement; precautions reviewed > pt to follow up with Dr. Mikel Cella to discuss headaches and irregular menses.  Encouraged to keep headache log > Singulair is next agent of choice

## 2013-05-04 NOTE — Progress Notes (Signed)
Interpreter K[facility] 

## 2013-05-04 NOTE — Progress Notes (Signed)
  [facility]  Patient name: Laurie Barnett MRN [id number]  Date of birth: Sep 01, 1981  CC & HPI:  Laurie Barnett is a 32 y.o. female presenting today for:  # Itching  Character:  Generalized itching started focally after birth but has now spread  Onset/Duration:  started ~ 1 year ago but has been progressively worsening   Severity:  severe  Aggravating:  +dermagraphia, worse following shower, heat induced urticatia  Associated Symptoms:  headaches  Effective Therapies:  beandryl  Inneffective Therapies:  cerave cream  ROS/RED FLAGS:  no fevers, no chills, no difficulty breathing, no difficulty swallowing No other meds currently No food associations No itchy eyes, runny nose Had stopped fabric softener; uses Dove Pt does report taking PNV for past 3+ years and has not discontinued it at any point.      ROS:  PER HPI  Pertinent History Reviewed:  Medical & Surgical Hx:  Reviewed: Significant for recent delivery ~ 1 year ago.  Healthy pregnancy Medications: Reviewed & Updated - see associated section Social History: Reviewed -  reports that she has never smoked. She does not have any smokeless tobacco history on file.  Objective Findings:  Vitals: BP 129/85  Pulse 82  Ht 5\' 1"  (1.549 m)  Wt 116 lb (52.617 kg)  BMI 21.93 kg/m2  LMP 04/19/2013  Breastfeeding? No  PE: GENERAL: Adult hispainc  female. 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

## 2013-06-04 ENCOUNTER — Ambulatory Visit: Payer: Self-pay

## 2013-08-05 ENCOUNTER — Ambulatory Visit (INDEPENDENT_AMBULATORY_CARE_PROVIDER_SITE_OTHER): Payer: No Typology Code available for payment source | Admitting: Family Medicine

## 2013-08-05 ENCOUNTER — Other Ambulatory Visit (HOSPITAL_COMMUNITY)
Admission: RE | Admit: 2013-08-05 | Discharge: 2013-08-05 | Disposition: A | Discharge status: Home / Self Care | Payer: No Typology Code available for payment source | Source: Ambulatory Visit | Attending: Family Medicine | Admitting: Family Medicine

## 2013-08-05 VITALS — BP 105/68 | HR 71 | Temp 98.4°F | Wt 118.0 lb

## 2013-08-05 DIAGNOSIS — Z23 Encounter for immunization: Secondary | ICD-10-CM

## 2013-08-05 DIAGNOSIS — Z01419 Encounter for gynecological examination (general) (routine) without abnormal findings: Secondary | ICD-10-CM | POA: Insufficient documentation

## 2013-08-05 DIAGNOSIS — R300 Dysuria: Secondary | ICD-10-CM

## 2013-08-05 DIAGNOSIS — Z124 Encounter for screening for malignant neoplasm of cervix: Secondary | ICD-10-CM

## 2013-08-05 DIAGNOSIS — L503 Dermatographic urticaria: Secondary | ICD-10-CM

## 2013-08-05 DIAGNOSIS — M549 Dorsalgia, unspecified: Secondary | ICD-10-CM

## 2013-08-05 LAB — POCT URINALYSIS DIPSTICK
Bilirubin, UA: NEGATIVE
Blood, UA: NEGATIVE
Glucose, UA: NEGATIVE
Leukocytes, UA: NEGATIVE
Nitrite, UA: NEGATIVE

## 2013-08-05 MED ORDER — TRIAMCINOLONE ACETONIDE 0.1 % EX CREA: TOPICAL_CREAM | Freq: Two times a day (BID) | CUTANEOUS | Status: DC

## 2013-08-05 MED ORDER — MELOXICAM 15 MG PO TABS: 15.0000 mg | ORAL_TABLET | Freq: Every day | ORAL | Status: DC

## 2013-08-05 NOTE — Progress Notes (Signed)
Patient ID: Laurie Barnett, female   DOB: July 21, 1981, 32 y.o.   MRN: [id number]  [facility] Edwin Cherian M. Linard Daft, MD Phone: [phone]   Subjective: HPI: Patient is a 32 y.o. female presenting to clinic today for physical exam. Concerns today include low back pain and rash  1. Physical - Need pap smear today. Using condoms for contraception. LMP 9/22-9/28. No concern for possible STD exposure. S/p C-section, overall doing well.   2. Low back pain - Started 15 days ago. No known injury. Never had pain like this before. No chance of pregnancy. No numbness/tingling of legs. Has not tried anything for the pain other than drinking water/cranberry juice. No dysuria but thinks this may be UTI.  3. Rash - Has daily rash all over body. After she scratches, she feels the area is red and painful. Using Zyrtec daily. Only Dove. 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   History Reviewed: Never smoker. Health Maintenance: Needs flu shot   ROS: Please see HPI above.  Objective: Office vital signs reviewed. BP 105/68  Pulse 71  Temp(Src) 98.4 F (36.9 C) (Oral)  Wt 118 lb (53.524 kg)  BMI 22.31 kg/m2  LMP 07/19/2013  Breastfeeding? No  Physical Examination:  General: Awake, alert. NAD HEENT: Atraumatic, normocephalic Pulm: CTAB, no wheezes Cardio: RRR, no murmurs appreciated Abdomen:+BS, soft, nontender, nondistended GU: No external lesions, cervix visualized with no problems. Scant bleeding after pap. No CMT or adnexal tenderness Back: FROM. No TTP appreciated Extremities: No edema Neuro: Grossly intact  Assessment: 32 y.o. female CPE  Plan: See Problem List and After Visit Summary

## 2013-08-05 NOTE — Patient Instructions (Signed)
It was good to see you today.  For your back, take Tylenol as needed. I have also given you pain medication that may help also. This is likely a muscle strain.  For your rash, I have sent in a prescription for cream to use as needed.  Follow up as needed.  Emerald Shor M. Demetri Goshert, M.D.  Dolor de espalda en el adulto (Back Pain, Adult)  El dolor de cintura es frecuente. Aproximadamente 1 de cada 5 personas lo sufren.La causa rara vez pone en peligro la vida. Con frecuencia mejora luego de algn tiempo.Alrededor de la mitad de las personas que sufren un inicio sbito de dolor de cintura, se sentirn mejor luego de 2 semanas. Aproximadamente 8 de cada 10 se sentirn mejor luego de 6 semanas.  CAUSAS  Algunas causas comunes son:   Distensin de los msculos o ligamentos que sostienen la columna vertebral.  Desgaste (degeneracin) de los discos vertebrales.  Artritis.  Traumatismos directos en la espalda. DIAGNSTICO  La mayor parte de las veces, la causa directa no se conoce.Sin embargo, Chief Technology Officer puede tratarse efectivamente an cuando no se Best boy.Una de las formas ms precisas de asegurar que la causa del dolor no constituye un peligro es responder a las preguntas del mdico acerca de su salud y sus sntomas. Si el mdico necesita ms informacin, podr indicar anlisis de laboratorio o Education officer, environmental un diagnstico por imgenes (radiografas o Health visitor).Sin embargo, aunque las [employer] modificaciones, generalmente no es necesaria la [place].  INSTRUCCIONES PARA EL CUIDADO EN EL HOGAR  En algunas personas, el dolor de espalda vuelve.Como rara vez es peligroso, los pacientes pueden aprender a Interior and spatial designer.   Mantngase activo. Si permanece sentado o de pie mucho tiempo en el mismo lugar, se tensiona la espalda.  No se siente, maneje ni se quede parado en un mismo lugar por ms de 30 minutos. Realice caminatas cortas en superficies planas ni bien el dolor  haya cedido. Trate de Copy tiempo que camina .  No se quede en la cama.Si hace reposo durante ms de 1 o 2 das, puede Estate agent.  No evite los ejercicios ni el trabajo.El cuerpo est hecho para moverse.No es peligroso estar [place], aunque le duela la espalda.La espalda se curar ms rpido si contina sus actividades antes de que el dolor se vaya.  Preste atencin a su cuerpo cuando se incline y se levante. Muchas personas sienten menos molestias cuando levantan objetos si doblan las rodillas, mantienen la carga cerca del cuerpo y evitan torcerse. Generalmente, las posiciones ms cmodas son las que ejercen menos tensin en la espalda en recuperacin.  Encuentre una posicin cmoda para dormir. Utilice un colchn firme y recustese de [place]. Doble ligeramente sus rodillas. Si se recuesta sobre su espalda, coloque una almohada debajo de sus rodillas.  Tome slo medicamentos de venta libre o recetados, segn las indicaciones del mdico. Los medicamentos de venta libre para Primary school teacher y reducir Futures trader, son los que en general ms ayudan.El mdico podr prescribirle relajantes musculares.Estos medicamentos calman el dolor de modo que pueda retornar a sus actividades normales y a Investment banker, operational.  Aplique hielo sobre la zona lesionada.  Ponga el hielo en una bolsa plstica.  Colquese una toalla entre la piel y la bolsa de hielo.  Deje la bolsa de hielo durante 15 a 20 minutos 3 a 4 veces por da, durante los primeros 2  3 das. Luego podr alternar Eusebio Me calor y  hielo para reducir Chief Technology Officer y los espasmos.  Consulte a su mdico si puede tratar de hacer ejercicios para la espalda y recibir un masaje suave. Pueden ser beneficiosos.  Evite sentirse ansioso o estresado.El estrs aumenta la tensin muscular y puede empeorar el dolor de espalda.Es importante reconocer cuando est ansioso o estresado y aprender la forma de  controlarlos.El ejercicio es una gran opcin. SOLICITE ATENCIN MDICA SI:   Siente un dolor que no se alivia con reposo o medicamentos.  El dolor no mejora en 1 semana.  Desarrolla nuevos sntomas.  No se siente bien en general. SOLICITE ATENCIN MDICA DE INMEDIATO SI:  Siente un dolor que se irradia desde la espalda hacia sus piernas.  Desarrolla nuevos problemas en el intestino o la vejiga.  Siente debilidad o adormecimiento inusual en sus brazos o piernas.  Presenta nuseas o vmitos.  Presenta dolor abdominal.  Se siente desfalleciente. Document Released: 10/08/2005 Document Revised: 04/08/2012 [facility] Patient Information 2014 [place], [place].

## 2013-08-06 DIAGNOSIS — M549 Dorsalgia, unspecified: Secondary | ICD-10-CM | POA: Insufficient documentation

## 2013-08-06 DIAGNOSIS — Z124 Encounter for screening for malignant neoplasm of cervix: Secondary | ICD-10-CM | POA: Insufficient documentation

## 2013-08-06 NOTE — Assessment & Plan Note (Signed)
Con't Zyrtec daily. Triamcinolone prn for itching

## 2013-08-06 NOTE — Assessment & Plan Note (Signed)
UA wnl. No UTI and not likely cause of back pain.

## 2013-08-06 NOTE — Assessment & Plan Note (Signed)
Most likely MSK. Use Tylenol as needed. No indication for X-ray at this time. Expect it to resolve on its own.

## 2013-08-06 NOTE — Assessment & Plan Note (Signed)
Pap completed today. Await results.

## 2013-08-07 ENCOUNTER — Encounter: Payer: Self-pay | Admitting: Family Medicine

## 2014-01-27 ENCOUNTER — Telehealth: Payer: Self-pay | Admitting: Family Medicine

## 2014-01-27 NOTE — Telephone Encounter (Signed)
Pt needs appt to renew OC.  Marines

## 2014-02-18 ENCOUNTER — Ambulatory Visit (INDEPENDENT_AMBULATORY_CARE_PROVIDER_SITE_OTHER): Payer: Self-pay | Admitting: Family Medicine

## 2014-02-18 DIAGNOSIS — N760 Acute vaginitis: Secondary | ICD-10-CM

## 2014-02-18 DIAGNOSIS — N898 Other specified noninflammatory disorders of vagina: Secondary | ICD-10-CM

## 2014-02-18 DIAGNOSIS — L293 Anogenital pruritus, unspecified: Secondary | ICD-10-CM

## 2014-02-18 LAB — POCT WET PREP (WET MOUNT): Clue Cells Wet Prep Whiff POC: NEGATIVE

## 2014-02-18 MED ORDER — FLUCONAZOLE 150 MG PO TABS: 150.0000 mg | ORAL_TABLET | Freq: Once | ORAL | Status: DC

## 2014-02-18 MED ORDER — TIOCONAZOLE 6.5 % VA OINT: 1.0000 | TOPICAL_OINTMENT | VAGINAL | Status: DC | PRN

## 2014-02-18 NOTE — Patient Instructions (Signed)
Vaginitis moniliásica  (Monilial Vaginitis)  La vaginitis es una inflamación (irritación, hinchazón) de la vagina y la vulva. Esta no es una enfermedad de transmisión sexual.   CAUSAS  Este tipo de vaginitis lo causa un hongo (candida) que normalmente se encuentra en la vagina. El hongo candida se ha desarrollado hasta el punto de ocasionar problemas en el equilibrio químico.  SÍNTOMAS  · Secreción vaginal espesa y blanca.  · Hinchazón, picazón, enrojecimiento e inflamación de la vagina y en algunos casos de los labios vaginales (vulva).  · Ardor o dolor al orinar.  · Dolor en las relaciones sexuales.  DIAGNÓSTICO  Los factores que favorecen la vaginitis moniliasica son:  · Etapas de virginidad y postmenopáusicas.  · Embarazo.  · Infecciones.  · Sentir cansancio, estar enferma o estresada, especialmente si ya ha sufrido este problema en el pasado.  · Diabetes Buen control ayudará a disminuír la probabilidad.  · Píldoras anticonceptivas  · Ropa interior muy ajustada.  · El uso de espumas de baño, aerosoles femeninos duchas vaginales o tampones con desodorante.  · Algunos antibióticos (medicamentos que destruyen gérmenes).  · Si contrae alguna enfermedad puede sufrir recurrencias esporádicas.  TRATAMIENTO  El profesional que lo asiste prescribirá medicamentos.  · Hay diferentes tipos de cremas y supositorios vaginales que tratan específicamente la vaginitis moniliásica. Para infecciones por hongos recurrentes, utilice un supositorio o crema en la vagina dos veces por semana, o según se le indique.  · También podrán utilizarse cremas con corticoides o anti moniliásicas para la picazón o la irritación de la vulva. Consulte con el profesional que la asiste.  · Si la crema no da resultado, podrá aplicarse en la vagina una solución con azul de metileno.  · El consumo de yogur puede prevenir este tipo de vaginitis.  INSTRUCCIONES PARA EL CUIDADO DOMICILIARIO  · Tome todos los medicamentos tal como se le indicó.  · No  mantenga relaciones sexuales hasta que el tratamiento se haya completado, o según las indicaciones del profesional que la asiste.  · Tome baños de asiento tibios.  · No se aplique duchas vaginales.  · No utilice tampones, especialmente los perfumados.  · Use ropa interior de algodón  · Evite los pantalones ajustados y las medias tipo panty.  · Comunique a sus compañeros sexuales que sufre una infección por hongos. Ellos deben concurrir para un control médico si tienen síntomas como una urticaria leve o picazón.  · Sus compañeros sexuales deben tratarse también si la infección es difícil de eliminar.  · Practique el sexo seguro  use condones  · Algunos medicamentos vaginales ocasionan fallas en los condones de látex. Los medicamentos vaginales que pueden dañar los condones son:  · Crema cleocina  · Butoconazole (Femstat®)  · Terconazole (Terazol®) supositorios vaginales  · Miconazole (Monistat®) (es un medicamento de venta libre)  SOLICITE ATENCIÓN MÉDICA SI:  · Usted tiene una temperatura oral de más de 38,9° C (102° F).  · Si la infección empeora luego de 2 días de tratamiento.  · Si la infección no mejora luego de 3 días de tratamiento.  · Aparecen ampollas en o alrededor de la vagina.  · Si aparece una hemorragia vaginal y no es el momento del período.  · Siente dolor al orinar.  · Presenta problemas intestinales.  · Tiene dolor durante las relaciones sexuales.  Document Released: 07/18/2005 Document Revised: 12/31/2011  ExitCare® Patient Information ©2014 [employer].

## 2014-02-18 NOTE — Progress Notes (Signed)
Patient ID: Laurie Barnett, female   DOB: 21-Dec-1980, 33 y.o.   MRN: [id number]    Subjective: HPI: Patient is a 33 y.o. female presenting to clinic today for vaginitis. Phone interpreter used for entire visit  Vaginitis Patient presents for evaluation of an abnormal vaginal discharge and itching. Symptoms have been present for 1 week. Vaginal symptoms: burning, discharge described as white and comes and goes and local irritation. Never had anything like this before. Contraception: condoms. She denies abnormal bleeding. Sexually transmitted infection risk: very low risk of STD exposure. Menstrual flow: regular every 28-30 days, LMP 2-3 weeks  History Reviewed: Never smoker.  ROS: Please see HPI above.  Objective: Office vital signs reviewed. There were no vitals taken for this visit.  Physical Examination:  General: Awake, alert. NAD HEENT: Atraumatic, normocephalic Abd: Soft, nontender, nondistended GU: NOrmal external genitalia. Thick white discharge noted in vagina. Cervix normal, no CMT Neuro: Grossly intact  Assessment: 33 y.o. female with vaginitis  Plan: See Problem List and After Visit Summary

## 2014-02-21 DIAGNOSIS — N760 Acute vaginitis: Secondary | ICD-10-CM | POA: Insufficient documentation

## 2014-02-21 NOTE — Assessment & Plan Note (Signed)
A: Vaginitis, consistent with yeast. NO red flags  P: - Diflucan - Topical antifungal for itch relief - F/u if fails to improve

## 2014-02-25 ENCOUNTER — Ambulatory Visit: Payer: Self-pay

## 2014-03-01 ENCOUNTER — Ambulatory Visit: Payer: Self-pay

## 2014-03-08 ENCOUNTER — Ambulatory Visit: Payer: No Typology Code available for payment source

## 2014-08-23 ENCOUNTER — Encounter: Payer: Self-pay | Admitting: Family Medicine

## 2014-08-23 ENCOUNTER — Ambulatory Visit (INDEPENDENT_AMBULATORY_CARE_PROVIDER_SITE_OTHER): Payer: No Typology Code available for payment source | Admitting: Family Medicine

## 2014-08-23 VITALS — BP 118/79 | HR 78 | Temp 98.4°F | Ht 61.0 in | Wt 123.0 lb

## 2014-08-23 DIAGNOSIS — K219 Gastro-esophageal reflux disease without esophagitis: Secondary | ICD-10-CM

## 2014-08-23 DIAGNOSIS — L299 Pruritus, unspecified: Secondary | ICD-10-CM

## 2014-08-23 LAB — CBC WITH DIFFERENTIAL/PLATELET
BASOS ABS: 0 10*3/uL (ref 0.0–0.1)
BASOS PCT: 0 % (ref 0–1)
EOS ABS: 0 10*3/uL (ref 0.0–0.7)
Eosinophils Relative: 1 % (ref 0–5)
HEMATOCRIT: 36.8 % (ref 36.0–46.0)
Hemoglobin: 12.7 g/dL (ref 12.0–15.0)
LYMPHS PCT: 33 % (ref 12–46)
Lymphs Abs: 1.5 10*3/uL (ref 0.7–4.0)
MCH: 28.5 pg (ref 26.0–34.0)
MCHC: 34.5 g/dL (ref 30.0–36.0)
MCV: 82.5 fL (ref 78.0–100.0)
MONO ABS: 0.2 10*3/uL (ref 0.1–1.0)
Monocytes Relative: 5 % (ref 3–12)
Neutro Abs: 2.7 10*3/uL (ref 1.7–7.7)
Neutrophils Relative %: 61 % (ref 43–77)
PLATELETS: 318 10*3/uL (ref 150–400)
RBC: 4.46 MIL/uL (ref 3.87–5.11)
RDW: 13.7 % (ref 11.5–15.5)
WBC: 4.5 10*3/uL (ref 4.0–10.5)

## 2014-08-23 LAB — C-REACTIVE PROTEIN: CRP: <0.5 mg/dL (ref <0.60)

## 2014-08-23 LAB — COMPREHENSIVE METABOLIC PANEL
ALK PHOS: 68 U/L (ref 39–117)
ALT: 14 U/L (ref 0–35)
AST: 11 U/L (ref 0–37)
Albumin: 4.2 g/dL (ref 3.5–5.2)
BUN: 5 mg/dL — AB (ref 6–23)
CO2: 27 mEq/L (ref 19–32)
CREATININE: 0.45 mg/dL — AB (ref 0.50–1.10)
Calcium: 8.8 mg/dL (ref 8.4–10.5)
Chloride: 103 mEq/L (ref 96–112)
GLUCOSE: 87 mg/dL (ref 70–99)
POTASSIUM: 4 meq/L (ref 3.5–5.3)
Sodium: 137 mEq/L (ref 135–145)
Total Bilirubin: 0.4 mg/dL (ref 0.2–1.2)
Total Protein: 7.2 g/dL (ref 6.0–8.3)

## 2014-08-23 LAB — TSH: TSH: 2.442 u[IU]/mL (ref 0.350–4.500)

## 2014-08-23 MED ORDER — RANITIDINE HCL 150 MG PO TABS: 150.0000 mg | ORAL_TABLET | Freq: Two times a day (BID) | ORAL | Status: DC

## 2014-08-23 NOTE — Patient Instructions (Signed)
Nice to meet you. We will get some lab work and will call you with the results.  Please start the zantac 150 mg twice a day.

## 2014-08-24 DIAGNOSIS — K219 Gastro-esophageal reflux disease without esophagitis: Secondary | ICD-10-CM | POA: Insufficient documentation

## 2014-08-24 NOTE — Progress Notes (Signed)
Patient ID: Laurie Barnett, female   DOB: 1980-10-31, 33 y.o.   MRN: [id number]  Marikay AlarEric Naleyah Ohlinger, MD Phone: [phone]  Laurie Barnett is a 33 y.o. female who presents today for f/u.  Interpretor, Graciella Namihira-Alfaro, used for the length of the visit.  Itching: patient reports full body itching for the past 2.5 years. Notes this started after the birth of her child. Started in fingers and toes and now is her whole body. When she scratches the area becomes raised and red. She notes no food associations. She changed her fabric softener to see if this would help. She uses dove soap. She notes itchy eyes infrequently. She notes this is worse with the change in seasons. She notes there has been associated fatigue. She has no animals at home. No new drugs. No recent travel. No history of asthma, allergic rhinitis, arthralgias, or fever. She does endorse she has had more hair falling out recently. No other contacts have this issue. Has been using cetirizine for this with benefit.  Reflux: notes the top of her stomach burns for the past 6 months. Notes sour taste in back of mouth with this. Has not noticed a food trigger. Happens 2-3x/week. No weight loss. No issues swallowing. Notes milk makes this better.   Patient is a nonsmoker.    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    Assessment/Plan: Please see individual problem list.  # Healthcare maintenance: up to date.  Marikay AlarEric Anavi Branscum, MD [facility] PGY[facility]3

## 2014-08-24 NOTE — Assessment & Plan Note (Signed)
Patient with burning epigastric pain and sour taste in mouth. Likely GERD, though will obtain CMET to evaluate liver pathology. Benign exam. Will treat with zantac 150 mg BID.

## 2014-08-24 NOTE — Assessment & Plan Note (Signed)
Patient with history of diffuse itching and dermagraphia. Several underlying causes should be evaluated such as thyroid dysfunction, inflammatory disorders, liver dysfunction, and malignancy. Last pap smear in 2014 was normal. Will obtain CMET, CRP, CBC with diff, and TSH to evaluate potential underlying causes. May be idiopathic. Will add zantac as an H2 blocker for this issue given her reflux symptoms.

## 2014-08-25 ENCOUNTER — Telehealth: Payer: Self-pay | Admitting: *Deleted

## 2014-08-25 NOTE — Telephone Encounter (Signed)
Pt informed of results by Wyvonnia DuskyGraciela Namihira (cone interpreter). Jyles Sontag,CMA

## 2014-08-25 NOTE — Telephone Encounter (Signed)
-----   Message from Glori LuisEric G Sonnenberg, MD sent at 08/25/2014  2:06 PM EST ----- Please inform the patient that her lab work was normal. It did not reveal a cause for her itching. She should continue the treatment that we discussed at her visit. If this does not improve her symptoms she should let us know. Thanks.

## 2014-10-11 ENCOUNTER — Ambulatory Visit: Payer: Self-pay

## 2015-04-18 ENCOUNTER — Ambulatory Visit: Payer: Self-pay

## 2015-05-25 ENCOUNTER — Encounter: Payer: Self-pay | Admitting: Family Medicine

## 2015-05-25 ENCOUNTER — Ambulatory Visit (INDEPENDENT_AMBULATORY_CARE_PROVIDER_SITE_OTHER): Payer: Self-pay | Admitting: Family Medicine

## 2015-05-25 VITALS — BP 117/77 | HR 92 | Temp 98.7°F | Ht 61.0 in | Wt 126.1 lb

## 2015-05-25 DIAGNOSIS — L503 Dermatographic urticaria: Secondary | ICD-10-CM

## 2015-05-25 DIAGNOSIS — L501 Idiopathic urticaria: Secondary | ICD-10-CM

## 2015-05-25 MED ORDER — CETIRIZINE HCL 10 MG PO TABS: 10.0000 mg | ORAL_TABLET | Freq: Every day | ORAL | Status: DC

## 2015-05-25 NOTE — Patient Instructions (Signed)
Ronchas  (Hives)  Las ronchas son reas de la piel inflamadas (hinchadas) rojas y que pican. Pueden cambiar de tamao y de ubicacin en el cuerpo. Las Armed forces operational officer y Geneticist, molecular durante algunas horas o das (ronchas agudas) o durante algunas semanas (ronchas crnicas). No pueden transmitirse de [place] persona a Theodoro Clock (no son contagiosas). Pueden empeorar al rascarse, hacer ejercicios y por estrs emocional.  CAUSAS   Reaccin alrgica a alimentos, aditivos o frmacos.  Infecciones, incluso el resfro comn.  Enfermedades, como la vasculitis, el lupus o la enfermedad tiroidea.  Exposicin al sol, al calor o al fro.  La prctica de ejercicios.  El estrs.  El contacto con algunas sustancias qumicas. SNTOMAS   Zonas hinchadas, rojas o blancas, sobre la piel. Las ronchas pueden cambiar de [place], forma, [place] y Armed forces logistics/support/administrative officer.  Picazn.  Hinchazn de las [employer] y [place]. Esto puede ocurrir si las ronchas se desarrollan en capas profundas de la piel. DIAGNSTICO  El mdico puede diagnosticar el problema haciendo un examen fsico. Conley Rolls indicar anlisis de sangre o un estudio de la piel para Production assistant, radio causa. En algunos casos, no puede determinarse la causa.  TRATAMIENTO  Los casos leves generalmente mejoran con medicamentos como los antihistamnicos. Los casos ms graves pueden requerir una inyeccin de epinefrina de Associate Professor. Si se conoce la causa de la urticaria, el tratamiento incluye evitar el factor desencadenante.  INSTRUCCIONES PARA EL CUIDADO EN EL HOGAR   Evite las causas que han desencadenado las ronchas.  Tome los antihistamnicos segn las indicaciones del mdico para reducir la gravedad de las ronchas. Generalmente se recomiendan los [employer] no son sedantes o con bajo efecto sedante. No conduzca vehculos mientras toma antihistamnicos.  Tome los medicamentos para la picazn exactamente como le indic el  mdico.  Use ropas sueltas.  Cumpla con todas las visitas de control, segn le indique su mdico. SOLICITE ATENCIN MDICA SI:   Siente una picazn intensa o persistente que no se calma con los medicamentos.  Le duelen las articulaciones o estn inflamadas. SOLICITE ATENCIN MDICA DE INMEDIATO SI:   Tiene fiebre.  Tiene la boca o los labios hinchados.  Tiene problemas para respirar o tragar.  Siente una opresin en la garganta o en el pecho.  Siente dolor abdominal. Estos problemas pueden ser los primeros signos de una reaccin alrgica que ponga en peligro la vida. Llame a los servicios de emergencia locales (911 en los [place]). ASEGRESE DE QUE:   Comprende estas instrucciones.  Controlar su enfermedad.  Solicitar ayuda de inmediato si no mejora o si empeora. Document Released: 10/08/2005 Document Revised: 10/13/2013 [facility] Patient Information 2015 [place], [place]. This information is not intended to replace advice given to you by your health care provider. Make sure you discuss any questions you have with your health care provider.     Thanks for coming in today.   We will refer you to allergy / immunology.   Continue to take the zyrtec daily to control your symptoms.   We will see you back for follow up in 6 months and earlier if needed.   Thanks for letting us take care of you!  Sincerely, Devota Pace, MD Family Medicine - PGY 2

## 2015-05-26 DIAGNOSIS — L501 Idiopathic urticaria: Secondary | ICD-10-CM | POA: Insufficient documentation

## 2015-05-26 NOTE — Assessment & Plan Note (Signed)
Has been ongoing for > 3 years. Never associated with weight loss, skin breakdown, or individual lesions, no GI symptoms. History consistent with chronic urticaria and symptoms relieved by Zyrtec.  - Recommend continuation of zyrtec - May resolve, but may also be long term.  - Pt. Requesting referral to allergy. Placed today.

## 2015-05-26 NOTE — Progress Notes (Signed)
Patient ID: Laurie Barnett, female   DOB: [birth year], 34 y.o.   MRN: [id number]   [facility] Yolande Jolly, MD Phone: [phone]  Subjective:   # Urticaria  - Pt. Has chronic urticaria.  - She has some questions about whether she will need to be on medication long term.  - She has had these symptoms for 3 years now beginning after her last pregnancy.  - She says that she takes zyrtec daily for her symptoms and that this is what has helped her the most.  - She says if she does not take the zyrtec, then she will inevitably get wheals starting with the backs of her hands and working up her arms. If she scratches, then she develops itchy wheels / lines wherever she scratches.  - She says she would like to get off of the medication, but she cannot because of her symptoms.  - She says if she takes the zyrtec, she does not get any of these symptoms.  - She wants to know if she will ever be able to come off of the medication, and she also wants a referral to allergy.   All relevant systems were reviewed and were negative unless otherwise noted in the HPI  Past Medical History Reviewed problem list.  Medications- reviewed and updated Current Outpatient Prescriptions  Medication Sig Dispense Refill  . cetirizine (ZYRTEC) 10 MG tablet Take 1-2 tablets (10-20 mg total) by mouth daily. 60 tablet 5  . diphenhydrAMINE (BENADRYL) 25 MG tablet Take 1 tablet (25 mg total) by mouth every 6 (six) hours as needed for itching. 30 tablet 0  . meloxicam (MOBIC) 15 MG tablet Take 1 tablet (15 mg total) by mouth daily. 30 tablet 0  . ranitidine (ZANTAC) 150 MG tablet Take 1 tablet (150 mg total) by mouth 2 (two) times daily. 60 tablet 2  . tioconazole (VAGISTAT-1) 6.5 % vaginal ointment Place 1 applicator vaginally as needed. 8 g 0  . triamcinolone cream (KENALOG) 0.1 % Apply topically 2 (two) times daily. 30 g 0   No current facility-administered medications for this visit.     Chief complaint-noted No additions to family history Social history- patient is a non smoker  Objective: BP 117/77 mmHg  Pulse 92  Temp(Src) 98.7 F (37.1 C) (Oral)  Ht  (1.549 m)  Wt 126 lb 1 oz (57.182 kg)  BMI 23.83 kg/m2  LMP 05/10/2015 Gen: NAD, alert, cooperative with exam HEENT: NCAT, EOMI, PERRL, TMs nml Neck: FROM, supple CV: RRR, good S1/S2, no murmur, cap refill <3 Resp: CTABL, no wheezes, non-labored Abd: SNTND, BS present, no guarding or organomegaly G/U: Pt. Deferred breast exam at this time.  Ext: No edema, warm, normal tone, moves UE/LE spontaneously Neuro: Alert and oriented, No gross deficits Skin: no rashes noted at this time. No lesions.   Assessment/Plan: See problem based a/p

## 2015-08-22 ENCOUNTER — Ambulatory Visit (INDEPENDENT_AMBULATORY_CARE_PROVIDER_SITE_OTHER): Payer: Self-pay | Admitting: Pediatrics

## 2015-08-22 ENCOUNTER — Encounter: Payer: Self-pay | Admitting: Pediatrics

## 2015-08-22 VITALS — BP 104/80 | HR 76 | Temp 98.2°F | Resp 16

## 2015-08-22 DIAGNOSIS — L501 Idiopathic urticaria: Secondary | ICD-10-CM

## 2015-08-22 NOTE — Progress Notes (Signed)
  [address] [place] [address] Dept: [phone]  FOLLOW UP NOTE  Patient ID: Laurie Barnett, female    DOB: 11-11-1980  Age: 34 y.o. MRN: [id number] Date of Office Visit: 08/22/2015  Assessment Chief Complaint: Pruritis  HPI Laurie Barnett presents for a follow-up visit because of hives for 3 years. 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 On 07/19/2015 she had a normal CBC with differential, normal sedimentation rate, normal comprehensive metabolic panel, normal total T4 and TSH, negative ANA and no thyroid antibodies. If she does not take cetirizine she itches. Salicylate free diet did not improve her symptoms.   Drug Allergies:  No Known Allergies  Physical Exam: BP 104/80 mmHg  Pulse 76  Temp(Src) 98.2 F (36.8 C) (Oral)  Resp 16   Physical Exam  Constitutional: She is oriented to person, place, and time. She appears well-developed and well-nourished.  HENT:  Eyes normal. Ears normal. Nose normal. Pharynx normal.  Neck: Neck supple. No thyromegaly present.  Cardiovascular:  S1 and S2 normal no murmurs  Pulmonary/Chest:  Clear to percussion and auscultation  Lymphadenopathy:    She has no cervical adenopathy.  Neurological: She is alert and oriented to person, place, and time.  Skin:  Clear but she had dermatographia noted    Diagnostics:  none  Assessment and Plan: 1. Idiopathic urticaria         Patient Instructions  Cetirizine 10 mg twice a day for itching Ranitidine 150 mg twice a day for itching    Return in about 6 weeks (around 10/03/2015).    Thank you for the opportunity to care for this patient.  Please do not hesitate to contact me with questions.  Tonette BihariJ. A. Elgar Scoggins, M.D.  [facility] [address] [place], [place] [address] [phone]

## 2015-08-22 NOTE — Patient Instructions (Signed)
Cetirizine 10 mg twice a day for itching Ranitidine 150 mg twice a day for itching

## 2015-10-10 ENCOUNTER — Ambulatory Visit: Payer: No Typology Code available for payment source | Admitting: Pediatrics

## 2015-11-18 ENCOUNTER — Emergency Department (HOSPITAL_COMMUNITY): Payer: No Typology Code available for payment source

## 2015-11-18 ENCOUNTER — Emergency Department (HOSPITAL_COMMUNITY)
Admission: EM | Admit: 2015-11-18 | Discharge: 2015-11-18 | Disposition: A | Discharge status: Home / Self Care | Payer: No Typology Code available for payment source | Attending: Emergency Medicine | Admitting: Emergency Medicine

## 2015-11-18 ENCOUNTER — Encounter (HOSPITAL_COMMUNITY): Payer: Self-pay

## 2015-11-18 DIAGNOSIS — Z3202 Encounter for pregnancy test, result negative: Secondary | ICD-10-CM | POA: Diagnosis not present

## 2015-11-18 DIAGNOSIS — Z79899 Other long term (current) drug therapy: Secondary | ICD-10-CM | POA: Diagnosis not present

## 2015-11-18 DIAGNOSIS — Y92410 Unspecified street and highway as the place of occurrence of the external cause: Secondary | ICD-10-CM | POA: Diagnosis not present

## 2015-11-18 DIAGNOSIS — R079 Chest pain, unspecified: Secondary | ICD-10-CM | POA: Diagnosis not present

## 2015-11-18 DIAGNOSIS — Z043 Encounter for examination and observation following other accident: Secondary | ICD-10-CM

## 2015-11-18 DIAGNOSIS — Z041 Encounter for examination and observation following transport accident: Secondary | ICD-10-CM

## 2015-11-18 DIAGNOSIS — S199XXA Unspecified injury of neck, initial encounter: Secondary | ICD-10-CM | POA: Insufficient documentation

## 2015-11-18 DIAGNOSIS — Y9389 Activity, other specified: Secondary | ICD-10-CM | POA: Diagnosis not present

## 2015-11-18 DIAGNOSIS — S0501XA Injury of conjunctiva and corneal abrasion without foreign body, right eye, initial encounter: Secondary | ICD-10-CM | POA: Insufficient documentation

## 2015-11-18 DIAGNOSIS — Y999 Unspecified external cause status: Secondary | ICD-10-CM | POA: Diagnosis not present

## 2015-11-18 DIAGNOSIS — S299XXA Unspecified injury of thorax, initial encounter: Secondary | ICD-10-CM | POA: Insufficient documentation

## 2015-11-18 DIAGNOSIS — Z792 Long term (current) use of antibiotics: Secondary | ICD-10-CM | POA: Diagnosis not present

## 2015-11-18 DIAGNOSIS — S0591XA Unspecified injury of right eye and orbit, initial encounter: Secondary | ICD-10-CM | POA: Diagnosis present

## 2015-11-18 DIAGNOSIS — Z8719 Personal history of other diseases of the digestive system: Secondary | ICD-10-CM | POA: Diagnosis not present

## 2015-11-18 DIAGNOSIS — S4992XA Unspecified injury of left shoulder and upper arm, initial encounter: Secondary | ICD-10-CM | POA: Diagnosis not present

## 2015-11-18 LAB — COMPREHENSIVE METABOLIC PANEL
ALBUMIN: 4.1 g/dL (ref 3.5–5.0)
ALT: 16 U/L (ref 14–54)
ANION GAP: 9 (ref 5–15)
AST: 21 U/L (ref 15–41)
Alkaline Phosphatase: 69 U/L (ref 38–126)
BUN: 7 mg/dL (ref 6–20)
CHLORIDE: 103 mmol/L (ref 101–111)
CO2: 24 mmol/L (ref 22–32)
Calcium: 9.1 mg/dL (ref 8.9–10.3)
Creatinine, Ser: 0.42 mg/dL — ABNORMAL LOW (ref 0.44–1.00)
GFR calc non Af Amer: >60 mL/min (ref >60)
GLUCOSE: 105 mg/dL — AB (ref 65–99)
Potassium: 3.9 mmol/L (ref 3.5–5.1)
SODIUM: 136 mmol/L (ref 135–145)
Total Bilirubin: 0.5 mg/dL (ref 0.3–1.2)
Total Protein: 7.2 g/dL (ref 6.5–8.1)

## 2015-11-18 LAB — URINALYSIS, ROUTINE W REFLEX MICROSCOPIC
Bilirubin Urine: NEGATIVE
Glucose, UA: NEGATIVE mg/dL
Hgb urine dipstick: NEGATIVE
Ketones, ur: 15 mg/dL — AB
NITRITE: NEGATIVE
Protein, ur: NEGATIVE mg/dL
SPECIFIC GRAVITY, URINE: 1.008 (ref 1.005–1.030)
pH: 7.5 (ref 5.0–8.0)

## 2015-11-18 LAB — I-STAT CHEM 8, ED
BUN: 8 mg/dL (ref 6–20)
CALCIUM ION: 1.2 mmol/L (ref 1.12–1.23)
Chloride: 103 mmol/L (ref 101–111)
Creatinine, Ser: 0.3 mg/dL — ABNORMAL LOW (ref 0.44–1.00)
GLUCOSE: 102 mg/dL — AB (ref 65–99)
HCT: 40 % (ref 36.0–46.0)
HEMOGLOBIN: 13.6 g/dL (ref 12.0–15.0)
POTASSIUM: 3.8 mmol/L (ref 3.5–5.1)
SODIUM: 140 mmol/L (ref 135–145)
TCO2: 24 mmol/L (ref 0–100)

## 2015-11-18 LAB — CBC
HCT: 37.2 % (ref 36.0–46.0)
Hemoglobin: 12.4 g/dL (ref 12.0–15.0)
MCH: 28.1 pg (ref 26.0–34.0)
MCHC: 33.3 g/dL (ref 30.0–36.0)
MCV: 84.2 fL (ref 78.0–100.0)
PLATELETS: 310 10*3/uL (ref 150–400)
RBC: 4.42 MIL/uL (ref 3.87–5.11)
RDW: 13.1 % (ref 11.5–15.5)
WBC: 7.1 10*3/uL (ref 4.0–10.5)

## 2015-11-18 LAB — URINE MICROSCOPIC-ADD ON

## 2015-11-18 LAB — POC URINE PREG, ED: Preg Test, Ur: NEGATIVE

## 2015-11-18 LAB — I-STAT BETA HCG BLOOD, ED (MC, WL, AP ONLY)

## 2015-11-18 MED ORDER — ERYTHROMYCIN 5 MG/GM OP OINT: TOPICAL_OINTMENT | OPHTHALMIC | Status: DC

## 2015-11-18 MED ORDER — FLUORESCEIN SODIUM 1 MG OP STRP
1.0000 | ORAL_STRIP | Freq: Once | OPHTHALMIC | Status: AC
  Administered 2015-11-18: 1 via OPHTHALMIC
  Filled 2015-11-18: qty 1

## 2015-11-18 MED ORDER — IBUPROFEN 400 MG PO TABS
600.0000 mg | ORAL_TABLET | Freq: Once | ORAL | Status: AC
  Administered 2015-11-18: 600 mg via ORAL
  Filled 2015-11-18: qty 1

## 2015-11-18 MED ORDER — ONDANSETRON HCL 4 MG/2ML IJ SOLN
4.0000 mg | Freq: Once | INTRAMUSCULAR | Status: AC
  Administered 2015-11-18: 4 mg via INTRAVENOUS
  Filled 2015-11-18: qty 2

## 2015-11-18 MED ORDER — ACETAMINOPHEN 325 MG PO TABS
650.0000 mg | ORAL_TABLET | Freq: Once | ORAL | Status: AC
  Administered 2015-11-18: 650 mg via ORAL
  Filled 2015-11-18: qty 2

## 2015-11-18 MED ORDER — TETRACAINE HCL 0.5 % OP SOLN
2.0000 [drp] | Freq: Once | OPHTHALMIC | Status: AC
  Administered 2015-11-18: 2 [drp] via OPHTHALMIC
  Filled 2015-11-18: qty 2

## 2015-11-18 MED ORDER — OXYCODONE-ACETAMINOPHEN 5-325 MG PO TABS: 1.0000 | ORAL_TABLET | Freq: Three times a day (TID) | ORAL | Status: DC | PRN
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
## 2015-11-18 NOTE — ED Notes (Signed)
Patient verbalized understanding of discharge instructions and denies any further needs or questions at this time. VS stable. Patient ambulatory with steady gait.  
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
## 2015-11-18 NOTE — ED Notes (Addendum)
Patient now reports pain in both eyes, MD made aware and to examine both for glass.

## 2015-11-18 NOTE — ED Notes (Signed)
MD cleared c-spine, soft collar removed. Patient ambulatory to restroom with steady gait. Pt denies pain with ambulation.

## 2016-01-17 ENCOUNTER — Ambulatory Visit: Payer: Self-pay | Admitting: Family Medicine

## 2016-01-24 ENCOUNTER — Ambulatory Visit: Payer: Self-pay

## 2016-01-31 ENCOUNTER — Encounter: Payer: Self-pay | Admitting: Family Medicine

## 2016-01-31 ENCOUNTER — Ambulatory Visit (INDEPENDENT_AMBULATORY_CARE_PROVIDER_SITE_OTHER): Payer: Self-pay | Admitting: Family Medicine

## 2016-01-31 VITALS — BP 116/81 | HR 81 | Temp 98.0°F | Ht 64.0 in | Wt 114.0 lb

## 2016-01-31 DIAGNOSIS — R0789 Other chest pain: Secondary | ICD-10-CM

## 2016-01-31 DIAGNOSIS — F4310 Post-traumatic stress disorder, unspecified: Secondary | ICD-10-CM | POA: Insufficient documentation

## 2016-01-31 MED ORDER — SERTRALINE HCL 50 MG PO TABS: 50.0000 mg | ORAL_TABLET | Freq: Every day | ORAL | Status: DC
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
## 2016-03-01 ENCOUNTER — Ambulatory Visit (INDEPENDENT_AMBULATORY_CARE_PROVIDER_SITE_OTHER): Payer: Self-pay | Admitting: Family Medicine

## 2016-03-01 ENCOUNTER — Encounter: Payer: Self-pay | Admitting: Family Medicine

## 2016-03-01 VITALS — BP 102/64 | HR 91 | Temp 98.4°F | Ht 64.0 in | Wt 116.0 lb

## 2016-03-01 DIAGNOSIS — F4310 Post-traumatic stress disorder, unspecified: Secondary | ICD-10-CM

## 2016-03-01 NOTE — Assessment & Plan Note (Signed)
Pt. Here for follow up. Symptoms improved but with side effect from Zoloft. Stopping for now to see how she does. Follow up in two weeks. If needing further treatment at that point will switch to Celexa.

## 2016-03-01 NOTE — Patient Instructions (Signed)
Thanks for coming in today.   Stop taking the zoloft.   Make a follow up appointment for 2 weeks.   If your symptoms return, then we can start a different medication.   I am so glad that you are doing better.   Thanks for letting us take care of you.   Sincerely, Devota Pacealeb Skylinn Vialpando, MD Family Medicine -PGY 2
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
## 2016-03-26 ENCOUNTER — Ambulatory Visit: Payer: Self-pay | Admitting: Family Medicine

## 2016-03-27 ENCOUNTER — Encounter: Payer: Self-pay | Admitting: Family Medicine

## 2016-03-27 ENCOUNTER — Ambulatory Visit (INDEPENDENT_AMBULATORY_CARE_PROVIDER_SITE_OTHER): Payer: Self-pay | Admitting: Family Medicine

## 2016-03-27 VITALS — BP 101/58 | HR 80 | Temp 99.3°F | Wt 116.0 lb

## 2016-03-27 DIAGNOSIS — F4310 Post-traumatic stress disorder, unspecified: Secondary | ICD-10-CM

## 2016-03-27 NOTE — Assessment & Plan Note (Addendum)
Symptoms improving significantly. Side effects of zoloft resolved after discontinuation. She is feeling much better overall, and has excellent support with her family. Remaining symptoms include significant anxiety while riding in the passenger side of the car. All other symptoms resolved.  - continue to discuss with family and friends as this has helped her the most.  - Exercise as able  - f/u 3 months to discuss symptoms.

## 2016-03-27 NOTE — Progress Notes (Signed)
Patient ID: Laurie Barnett, female   DOB: 1980/10/31, 35 y.o.   MRN: [id number]   [facility] Yolande Jolly, MD Phone: [phone]  Subjective:   # PTSD - she has been doing much better without the medicine.  - She is not having the dizziness or sleepiness any more. These were concerning side effects of the zoloft - she says that immediately after she stopped the medicine her symptoms improved. - - She is still having panic symptoms.  - She says that she has symptoms of panic when riding in the passenger seat in the car each time she rides in the care.   - she says the panic is a little less  - her symptoms occur only when she is riding and not when she is driving.  - she gets very anxious when others are driving and they "get too close to a car or a rail".  - she has not had a hard time sleeping, she says she is able to sleep all night at this time.  - She says that her panic is only whenever she is in a car.  - she is no longer getting bad dreams or nightmares.  - does not feel like this is impairing her daily life.   # Depressed Mood  - has had depressed mood and anhedonia for the past several weeks.  - feels like her appetite is improving.  - she does not feel like these symptoms are impairing her daily life.  - She feels like overall she is improving, and will continue to work on talking with friends and family to help with this.   All relevant systems were reviewed and were negative unless otherwise noted in the HPI  Past Medical History Reviewed problem list.  Medications- reviewed and updated Current Outpatient Prescriptions  Medication Sig Dispense Refill  . cetirizine (ZYRTEC) 10 MG tablet Take 1-2 tablets (10-20 mg total) by mouth daily. 60 tablet 5  . diphenhydrAMINE (BENADRYL) 25 MG tablet Take 1 tablet (25 mg total) by mouth every 6 (six) hours as needed for itching. (Patient not taking: Reported on 08/22/2015) 30 tablet 0  .  erythromycin ophthalmic ointment Place a 1/2 inch ribbon of ointment into the lower eyelid every night for 7 days 3.5 g 1  . meloxicam (MOBIC) 15 MG tablet Take 1 tablet (15 mg total) by mouth daily. (Patient not taking: Reported on 08/22/2015) 30 tablet 0  . oxyCODONE-acetaminophen (PERCOCET/ROXICET) 5-325 MG tablet Take 1 tablet by mouth every 8 (eight) hours as needed for severe pain. 10 tablet 0  . ranitidine (ZANTAC) 150 MG tablet Take 1 tablet (150 mg total) by mouth 2 (two) times daily. (Patient not taking: Reported on 08/22/2015) 60 tablet 2  . sertraline (ZOLOFT) 50 MG tablet Take 1 tablet (50 mg total) by mouth daily. 30 tablet 1  . tioconazole (VAGISTAT-1) 6.5 % vaginal ointment Place 1 applicator vaginally as needed. (Patient not taking: Reported on 08/22/2015) 8 g 0  . triamcinolone cream (KENALOG) 0.1 % Apply topically 2 (two) times daily. (Patient not taking: Reported on 08/22/2015) 30 g 0   No current facility-administered medications for this visit.   Chief complaint-noted No additions to family history Social history- patient is a non smoker  Objective: BP 101/58 mmHg  Pulse 80  Temp(Src) 99.3 F (37.4 C) (Oral)  Wt 116 lb (52.617 kg)  SpO2 100%  LMP 03/17/2016 Gen: NAD, alert, cooperative with exam HEENT: NCAT, EOMI, PERRL Neck:  FROM, supple CV: RRR, good S1/S2, no murmur Resp: CTABL, no wheezes, non-labored Abd: SNTND, BS present, no guarding or organomegaly Ext: No edema, warm, normal tone, moves UE/LE spontaneously Neuro: Alert and oriented, No gross deficits Skin: no rashes no lesions Psych:  Appropriate mood / affect. Not anxious.   Assessment/Plan:  PTSD (post-traumatic stress disorder) Symptoms improving significantly. Side effects of zoloft resolved after discontinuation. She is feeling much better overall, and has excellent support with her family. Remaining symptoms include significant anxiety while riding in the passenger side of the car. All other  symptoms resolved.  - continue to discuss with family and friends as this has helped her the most.  - Exercise as able  - f/u 3 months to discuss symptoms.    Depressed Mood- concerned for depression previously. 2 symptoms, but no functional deficit. Does not want medication, and feels that her coping methods are working well.

## 2016-03-27 NOTE — Patient Instructions (Signed)
Thanks for coming in today.   I'm very glad that you are feeling better.   Continue to talk with your friends and family as you are able to cope with your symptoms.   We will continue to hold off on starting any medication at this time.   Follow up in 3 months to make sure things are continuing to improve and for a general checkup.   Thanks for letting us take care of you.   Sincerely, Devota Pacealeb Cortavious Nix, MD Family Medicine -PGY 2

## 2016-06-27 ENCOUNTER — Other Ambulatory Visit (HOSPITAL_COMMUNITY)
Admission: RE | Admit: 2016-06-27 | Discharge: 2016-06-27 | Disposition: A | Discharge status: Home / Self Care | Payer: Self-pay | Source: Ambulatory Visit | Attending: Family Medicine | Admitting: Family Medicine

## 2016-06-27 ENCOUNTER — Ambulatory Visit (INDEPENDENT_AMBULATORY_CARE_PROVIDER_SITE_OTHER): Payer: Self-pay | Admitting: Family Medicine

## 2016-06-27 ENCOUNTER — Encounter: Payer: Self-pay | Admitting: Family Medicine

## 2016-06-27 VITALS — BP 114/60 | HR 89 | Temp 98.2°F | Wt 115.0 lb

## 2016-06-27 DIAGNOSIS — B373 Candidiasis of vulva and vagina: Secondary | ICD-10-CM

## 2016-06-27 DIAGNOSIS — Z01419 Encounter for gynecological examination (general) (routine) without abnormal findings: Secondary | ICD-10-CM | POA: Insufficient documentation

## 2016-06-27 DIAGNOSIS — B3731 Acute candidiasis of vulva and vagina: Secondary | ICD-10-CM

## 2016-06-27 DIAGNOSIS — Z113 Encounter for screening for infections with a predominantly sexual mode of transmission: Secondary | ICD-10-CM | POA: Insufficient documentation

## 2016-06-27 DIAGNOSIS — Z1151 Encounter for screening for human papillomavirus (HPV): Secondary | ICD-10-CM | POA: Insufficient documentation

## 2016-06-27 DIAGNOSIS — Z124 Encounter for screening for malignant neoplasm of cervix: Secondary | ICD-10-CM

## 2016-06-27 LAB — POCT WET PREP (WET MOUNT)
Clue Cells Wet Prep Whiff POC: NEGATIVE
Trichomonas Wet Prep HPF POC: ABSENT

## 2016-06-27 MED ORDER — FLUCONAZOLE 150 MG PO TABS
150.0000 mg | ORAL_TABLET | Freq: Once | ORAL | 0 refills | Status: AC
Start: 2016-06-27 — End: 2016-06-27

## 2016-06-27 NOTE — Progress Notes (Signed)
   Subjective: CC: vaginal discharge GEX:[id number]:Laurie Barnett is a 35 y.o. female presenting to clinic today for same day appointment. PCP: Velora HecklerHaney,Alyssa, MD Concerns today include:  1. Vaginal Itching Patient reports that itching started 1 month ago.  She notes that it improved somewhat after her menstrual cycle.  However about 1 week ago it started getting worse again.  She has been using vagisil but it has not worked.  Has not been on abx recently. She reports that she has recently changed her clothing detergent about 1 year ago.  She denies vaginal odors.  She denies  abnormal vaginal bleeding, dysuria, hematuria, pelvic pain, nausea, vomiting, fevers.   No history of STIs.  She is sexually active.    Contraception: condom.  Patient's last menstrual period was 06/14/2016.  Social History Reviewed. FamHx and MedHx reviewed.  Please see EMR. Health Maintenance: flu shot  ROS: Per HPI  Objective: Office vital signs reviewed. BP 114/60   Pulse 89   Temp 98.2 F (36.8 C) (Oral)   Wt 115 lb (52.2 kg)   LMP 06/14/2016   SpO2 100%   BMI 19.74 kg/m   Physical Examination:  General: Awake, alert, well nourished, No acute distress GU: external vaginal tissue irritated, cervix somewhat friable, no punctate lesions on cervix appreciated, moderate white caseous  discharge from cervical os, scant bleeding, no cervical motion tenderness, no abdominal/ adnexal masses  Assessment/ Plan: 35 y.o. female   1. Vaginal candidiasis.  Exam is consistent with this. - Cervicovaginal ancillary only - POCT Wet Prep ([place]et [place]) - fluconazole (DIFLUCAN) 150 MG tablet; Take 1 tablet (150 mg total) by mouth once. May repeat in 3 days if no improvement in symptoms.  Dispense: 2 tablet; Refill: 0  2. Pap smear for cervical cancer screening - Cytology - PAP.  HPV, GC/ CT added to pap - Will contact with results - Patient also requests letter to be sent  Follow up with pcp as scheduled for routine  care.  Raliegh IpAshly M Euel Castile, DO PGY-3, [facility]

## 2016-06-27 NOTE — Patient Instructions (Signed)
Fue Curatorun placer conocerte.  Si tiene alguna pregunta, por favor pngase en contacto conmigo [phone] .    Delynn FlavinAshly Gottschalk, DO    Vaginitis monilisica (Monilial Vaginitis) La vaginitis es una inflamacin (irritacin, hinchazn) de la vagina y la vulva. Esta no es una enfermedad de transmisin sexual.  CAUSAS Este tipo de vaginitis lo causa un hongo (candida) que normalmente se encuentra en la vagina. El hongo candida se ha desarrollado hasta el punto de ocasionar problemas en el equilibrio qumico. SNTOMAS  Secrecin vaginal espesa y blanca.  Hinchazn, picazn, enrojecimiento e inflamacin de la vagina y en algunos casos de los labios vaginales (vulva).  Ardor o dolor al ConocoPhillipsorinar.  Dolor en las relaciones sexuales. DIAGNSTICO Los factores que favorecen la vaginitis moniliasica son:  Everlean Pattersontapas de virginidad y postmenopusicas.  Embarazo.  Infecciones.  Sentir cansancio, estar enferma o estresada, especialmente si ya ha sufrido este problema en el pasado.  Diabetes Buen control ayudar a disminur la probabilidad.  Pldoras anticonceptivas  Ropa interior [place] Islandsmuy ajustada.  El uso de espumas de bao, aerosoles femeninos duchas vaginales o tampones con desodorante.  Algunos antibiticos (medicamentos que destruyen grmenes).  Si contrae alguna enfermedad puede sufrir recurrencias espordicas. TRATAMIENTO El profesional que lo asiste prescribir medicamentos.  Hay diferentes tipos de cremas y supositorios vaginales que tratan especficamente la vaginitis monilisica. Para infecciones por hongos recurrentes, utilice un supositorio o crema en la vagina dos veces por semana, o segn se le indique.  Tambin podrn utilizarse cremas con corticoides o anti monilisicas para la picazn o la irritacin de la vulva. Consulte con el profesional que la asiste.  Si la crema no da resultado, podr aplicarse en la vagina una solucin con azul de metileno.  El consumo de yogur puede  prevenir este tipo de vaginitis. INSTRUCCIONES PARA EL CUIDADO DOMICILIARIO  Tome todos los medicamentos tal como se le indic.  No mantenga relaciones sexuales hasta que el tratamiento se haya completado, o segn las indicaciones del profesional que la asiste.  Tome baos de asiento tibios.  No se aplique duchas vaginales.  No utilice tampones, especialmente los perfumados.  Use ropa interior de algodn  [employer] los pantalones ajustados y las medias tipo panty.  Comunique a sus compaeros sexuales que sufre una infeccin por hongos. Ellos deben concurrir para un control mdico si tienen sntomas como una urticaria leve o picazn.  Sus compaeros sexuales deben tratarse tambin si la infeccin es difcil de Pharmacologisteliminar.  Practique el sexo seguro - use condones  Algunos medicamentos vaginales ocasionan fallas en los condones de ltex. Los medicamentos vaginales que pueden daar los condones son:  ChiropodistCrema cleocina  Butoconazole (Femstat)  Terconazole (Terazol) supositorios vaginales  Miconazole (Monistat) (es un medicamento de venta libre) SOLICITE ATENCIN MDICA SI:  Daphane ShepherdUsted tiene una temperatura oral de ms de 38,9 C (102 F).  Si la infeccin empeora luego de 2 [address]das de tratamiento.  Si la infeccin no mejora luego de 3 [address] de tratamiento.  Aparecen ampollas en o alrededor de la vagina.  Si aparece una hemorragia vaginal y no es el momento del perodo.  Siente dolor al ConocoPhillipsorinar.  Presenta problemas intestinales.  Tiene dolor durante las The St. Paul Travelersrelaciones sexuales.   Esta informacin no tiene Theme park managercomo fin reemplazar el consejo del mdico. Asegrese de hacerle al mdico cualquier pregunta que tenga.   Document Released: 07/18/2005 Document Revised: 12/31/2011 Elsevier Interactive Patient Education [employer]2016 Elsevier Inc.

## 2016-06-28 ENCOUNTER — Encounter: Payer: Self-pay | Admitting: Family Medicine

## 2016-06-28 LAB — CYTOLOGY - PAP

## 2017-12-16 ENCOUNTER — Other Ambulatory Visit: Payer: Self-pay

## 2017-12-23 LAB — CYTOLOGY - PAP: DIAGNOSIS: NEGATIVE

## 2023-01-02 ENCOUNTER — Other Ambulatory Visit: Payer: Self-pay

## 2023-01-02 DIAGNOSIS — Z1231 Encounter for screening mammogram for malignant neoplasm of breast: Secondary | ICD-10-CM

## 2023-02-14 ENCOUNTER — Ambulatory Visit: Payer: Self-pay | Admitting: Hematology and Oncology

## 2023-02-14 ENCOUNTER — Ambulatory Visit
Admission: RE | Admit: 2023-02-14 | Discharge: 2023-02-14 | Disposition: A | Discharge status: Home / Self Care | Payer: No Typology Code available for payment source | Source: Ambulatory Visit | Attending: Obstetrics and Gynecology | Admitting: Obstetrics and Gynecology

## 2023-02-14 VITALS — BP 120/77 | Wt 126.0 lb

## 2023-02-14 DIAGNOSIS — Z1231 Encounter for screening mammogram for malignant neoplasm of breast: Secondary | ICD-10-CM

## 2023-02-14 NOTE — Patient Instructions (Signed)
Taught Laurie Barnett about self breast awareness and gave educational materials to take home. Patient did not need a Pap smear today due to last Pap smear was in 2023 per patient. Let her know [facility] will cover Pap smears every 5 years unless has a history of abnormal Pap smears. Referred patient to the [facility] for screening mammogram. Appointment scheduled for 02/14/23. Patient aware of appointment and will be there. Let patient know will follow up with her within the next couple weeks with results. Laurie Barnett verbalized understanding.  Pascal Lux, NP 9:16 AM

## 2023-02-14 NOTE — Progress Notes (Signed)
Ms. Graci Hulce Delgadillo is a 42 y.o. female who presents to [facility] clinic today with no complaints.    Pap Smear: Pap not smear completed today. Last Pap smear was 2023 at [facility] clinic and was normal. Per patient has no history of an abnormal Pap smear. Last Pap smear result is not available in Epic.   Physical exam: Breasts Breasts symmetrical. No skin abnormalities bilateral breasts. No nipple retraction bilateral breasts. No nipple discharge bilateral breasts. No lymphadenopathy. No lumps palpated bilateral breasts.       Pelvic/Bimanual Pap is not indicated today    Smoking History: Patient has never smoked and was not referred to quit line.    Patient Navigation: Patient education provided. Access to services provided for patient through [facility] program. Byrd Hesselbach  interpreter provided. No transportation provided   Colorectal Cancer Screening: Per patient has never had colonoscopy completed No complaints today.    Breast and Cervical Cancer Risk Assessment: Patient does not have family history of breast cancer, known genetic mutations, or radiation treatment to the chest before age 69. Patient does not have history of cervical dysplasia, immunocompromised, or DES exposure in-utero.  Risk Scores as of 02/14/2023     Dondra Spry           5-year 0.64 %   Lifetime 8.79 %   This patient is Hisp[place]/Latina but has no documented birth country, so the [place] model used data from [place] patients to calculate their risk score. Document a birth country in the Demographics activity for a more accurate score.         Last calculated by Caprice Red, CMA on 02/14/2023 at  8:57 AM        A: [facility] exam without pap smear No complaints with benign exam.   P: Referred patient to the [facility] for a screening mammogram. Appointment scheduled 02/14/23.  Pascal Lux, NP 02/14/2023 9:14 AM

## 2023-04-17 ENCOUNTER — Other Ambulatory Visit: Payer: Self-pay
